# Patient Record
Sex: Female | Born: 1990 | Race: Black or African American | Hispanic: No | Marital: Single | Smoking: Never smoker
Health system: Southern US, Community
[De-identification: ages and names within clinical notes are randomized; demographics above are authoritative.]

## PROBLEM LIST (undated history)

## (undated) DIAGNOSIS — Z3400 Encounter for supervision of normal first pregnancy, unspecified trimester: Secondary | ICD-10-CM

## (undated) DIAGNOSIS — Z3490 Encounter for supervision of normal pregnancy, unspecified, unspecified trimester: Secondary | ICD-10-CM

## (undated) HISTORY — DX: Encounter for supervision of normal first pregnancy, unspecified trimester: Z34.00

---
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
## 2012-12-10 ENCOUNTER — Encounter (HOSPITAL_COMMUNITY): Payer: Self-pay | Admitting: Emergency Medicine

## 2012-12-10 ENCOUNTER — Emergency Department (INDEPENDENT_AMBULATORY_CARE_PROVIDER_SITE_OTHER)
Admission: EM | Admit: 2012-12-10 | Discharge: 2012-12-10 | Disposition: A | Discharge status: Home / Self Care | Payer: No Typology Code available for payment source | Source: Home / Self Care | Attending: Emergency Medicine | Admitting: Emergency Medicine

## 2012-12-10 DIAGNOSIS — M2669 Other specified disorders of temporomandibular joint: Secondary | ICD-10-CM

## 2012-12-10 DIAGNOSIS — F458 Other somatoform disorders: Secondary | ICD-10-CM

## 2012-12-10 DIAGNOSIS — M26629 Arthralgia of temporomandibular joint, unspecified side: Secondary | ICD-10-CM

## 2012-12-10 MED ORDER — CYCLOBENZAPRINE HCL 5 MG PO TABS: 5.0000 mg | ORAL_TABLET | Freq: Three times a day (TID) | ORAL | Status: DC | PRN

## 2012-12-10 MED ORDER — IBUPROFEN 800 MG PO TABS: 800.0000 mg | ORAL_TABLET | Freq: Three times a day (TID) | ORAL | Status: DC

## 2012-12-10 NOTE — ED Provider Notes (Signed)
CSN: [id number]     Arrival date & time 12/10/12  1046 History   First MD Initiated Contact with Patient 12/10/12 1214     Chief Complaint  Patient presents with  . Dental Pain   (Consider location/radiation/quality/duration/timing/severity/associated sxs/prior Treatment) HPI Comments: Pt reports clenching her teeth at night while asleep. Sometimes has to "pop" jaw in the morning to be able to open mouth completely. Woke up 2 mornings ago and was not able to open mouth/pop jaw.    Patient is a 22 y.o. female presenting with tooth pain. The history is provided by the patient.  Dental Pain Toothache location: R jaw. Quality:  Constant and aching Severity:  Moderate Onset quality:  Gradual Duration:  2 days Timing:  Constant Progression:  Unchanged Chronicity:  New Relieved by:  None tried Worsened by:  Nothing tried Ineffective treatments:  None tried Associated symptoms: no fever   Risk factors: periodontal disease     Past Medical History  Diagnosis Date  . Normal pregnancy, first 10/28/2011  . Normal pregnancy 10/29/2011  . SVD (spontaneous vaginal delivery) 10/29/2011   History reviewed. No pertinent past surgical history. Family History  Problem Relation Age of Onset  . Hyperlipidemia Mother   . Hypertension Mother   . Stroke Maternal Grandmother   . Hypertension Maternal Grandmother   . Hyperlipidemia Maternal Grandmother    History  Substance Use Topics  . Smoking status: Never Smoker   . Smokeless tobacco: Not on file  . Alcohol Use: No   OB History   Grav Para Term Preterm Abortions TAB SAB Ect Mult Living   1 1 1       1      Review of Systems  Constitutional: Negative for fever and chills.  HENT: Positive for dental problem.     Allergies  Review of patient's allergies indicates no known allergies.  Home Medications   Current Outpatient Rx  Name  Route  Sig  Dispense  Refill  . cyclobenzaprine (FLEXERIL) 5 MG tablet   Oral   Take 1 tablet (5  mg total) by mouth 3 (three) times daily as needed for muscle spasms.   30 tablet   0   . ibuprofen (ADVIL,MOTRIN) 800 MG tablet   Oral   Take 1 tablet (800 mg total) by mouth 3 (three) times daily.   21 tablet   0    BP 141/84  Pulse 101  Temp(Src) 98 F (36.7 C) (Oral)  Resp 18  SpO2 99% Physical Exam  Constitutional: She appears well-developed and well-nourished. No distress.  HENT:  Mouth/Throat: Dental caries present.  R tmj stiff/tender, unable to open mouth all the way.  Can do ROM with lower jaw, and mouth able to open slightly more after that.     ED Course  Procedures (including critical care time) Labs Review Labs Reviewed - No data to display Imaging Review No results found.  EKG Interpretation    Date/Time:    Ventricular Rate:    PR Interval:    QRS Duration:   QT Interval:    QTC Calculation:   R Axis:     Text Interpretation:              MDM   1. Bruxism   2. TMJ syndrome   pt to purchase mouth guard from drug store and use nightly. Rx ibuprofen 800mg  TID #21 and rx flexeril 5mg  TID prn #30. Pt to f/u with dentist asap. Pt to do mouth stretches  at least BID     Cathlyn Parsons, NP 12/10/12 1248

## 2012-12-10 NOTE — ED Notes (Signed)
C/o lockjaw since saturday States at night while sleeping, patient does clench down on teeth tight Has taken tylenol but no relief.  States she has had tdap in 2012

## 2012-12-10 NOTE — ED Provider Notes (Signed)
Medical screening examination/treatment/procedure(s) were performed by non-physician practitioner and as supervising physician I was immediately available for consultation/collaboration.  Leslee Home, M.D.  Reuben Likes, MD 12/10/12 [phone]

## 2012-12-31 ENCOUNTER — Encounter (HOSPITAL_COMMUNITY): Payer: Self-pay | Admitting: Emergency Medicine

## 2012-12-31 ENCOUNTER — Emergency Department (HOSPITAL_COMMUNITY)
Admission: EM | Admit: 2012-12-31 | Discharge: 2012-12-31 | Disposition: A | Discharge status: Home / Self Care | Payer: No Typology Code available for payment source | Attending: Emergency Medicine | Admitting: Emergency Medicine

## 2012-12-31 DIAGNOSIS — R000 Tachycardia, unspecified: Secondary | ICD-10-CM | POA: Insufficient documentation

## 2012-12-31 DIAGNOSIS — M26609 Unspecified temporomandibular joint disorder, unspecified side: Secondary | ICD-10-CM | POA: Insufficient documentation

## 2012-12-31 DIAGNOSIS — Z791 Long term (current) use of non-steroidal anti-inflammatories (NSAID): Secondary | ICD-10-CM | POA: Insufficient documentation

## 2012-12-31 NOTE — ED Provider Notes (Signed)
Medical screening examination/treatment/procedure(s) were performed by non-physician practitioner and as supervising physician I was immediately available for consultation/collaboration.  EKG Interpretation   None         Mabelle Mungin B. Hawa Henly, MD 12/31/12 1526 

## 2012-12-31 NOTE — ED Notes (Signed)
Pt states "jaw sometimes locks when I sleep.  I went to Urgent Care a couple of weeks ago and they gave me muscle relaxers and Ibuprofen".  Pt states her jaw still feels tight.  Pt is able to move mouth.

## 2012-12-31 NOTE — ED Provider Notes (Signed)
CSN: [id number]     Arrival date & time 12/31/12  1203 History   None   This chart was scribed for Sierra Murray, a non-physician practitioner working with Leonette Most B. Bernette Mayers, MD by Lewanda Rife, ED Scribe. This patient was seen in room TR09C/TR09C and the patient's care was started at 1:44 PM     Chief Complaint  Patient presents with  . Jaw Pain   (Consider location/radiation/quality/duration/timing/severity/associated sxs/prior Treatment) The history is provided by the patient. No language interpreter was used.   HPI Comments: Sierra Murray is a 22 y.o. female who presents to the Emergency Department complaining of occasional moderate right sided jaw pain onset 2 weeks. Reports being evaluated for the same symptoms at urgent care and was prescribed flexeril and 800 mg of ibuprofen with no relief of symptoms. Reports symptoms are exacerbated by eating. Denies associated dental pain, fever, facial swelling, and dysphagia.   Past Medical History  Diagnosis Date  . Normal pregnancy, first 10/28/2011  . Normal pregnancy 10/29/2011  . SVD (spontaneous vaginal delivery) 10/29/2011   History reviewed. No pertinent past surgical history. Family History  Problem Relation Age of Onset  . Hyperlipidemia Mother   . Hypertension Mother   . Stroke Maternal Grandmother   . Hypertension Maternal Grandmother   . Hyperlipidemia Maternal Grandmother    History  Substance Use Topics  . Smoking status: Never Smoker   . Smokeless tobacco: Not on file  . Alcohol Use: No   OB History   Grav Para Term Preterm Abortions TAB SAB Ect Mult Living   1 1 1       1      Review of Systems  Constitutional: Negative for fever.  Psychiatric/Behavioral: Negative for confusion.   A complete 10 system review of systems was obtained and all systems are negative except as noted in the HPI and PMHx.    Allergies  Review of patient's allergies indicates no known allergies.  Home Medications    Current Outpatient Rx  Name  Route  Sig  Dispense  Refill  . cyclobenzaprine (FLEXERIL) 5 MG tablet   Oral   Take 1 tablet (5 mg total) by mouth 3 (three) times daily as needed for muscle spasms.   30 tablet   0   . ibuprofen (ADVIL,MOTRIN) 800 MG tablet   Oral   Take 1 tablet (800 mg total) by mouth 3 (three) times daily.   21 tablet   0    BP 129/82  Pulse 104  Temp(Src) 99 F (37.2 C) (Oral)  Resp 14  Ht 5\' 5"  (1.651 m)  Wt 190 lb 7 oz (86.382 kg)  BMI 31.69 kg/m2  SpO2 100% Physical Exam  Nursing note and vitals reviewed. Constitutional: She is oriented to person, place, and time. She appears well-developed and well-nourished. No distress.  HENT:  Head: Normocephalic and atraumatic.  Mouth/Throat: Uvula is midline, oropharynx is clear and moist and mucous membranes are normal. Mucous membranes are not dry. No trismus in the jaw. Abnormal dentition. No dental caries. No oropharyngeal exudate, posterior oropharyngeal edema or posterior oropharyngeal erythema.  TTP over right TMJ, crepitus with movement on right side.   No facial swelling   Eyes: EOM are normal.  Neck: Neck supple. No tracheal deviation present.  Cardiovascular: Regular rhythm and normal heart sounds.  Tachycardia present.   No murmur heard. Tachy ~ 100.  Pulmonary/Chest: Effort normal and breath sounds normal. No respiratory distress. She has no wheezes. She has no  rales.  Musculoskeletal: Normal range of motion.  Neurological: She is alert and oriented to person, place, and time.  Skin: Skin is warm and dry.  Psychiatric: She has a normal mood and affect. Her behavior is normal.    ED Course  Procedures  COORDINATION OF CARE:  Nursing notes reviewed. Vital signs reviewed. Initial pt interview and examination performed.   1:44 PM-Discussed treatment plan with pt at bedside. Pt agrees with plan.   Treatment plan initiated:Medications - No data to display   Initial diagnostic testing  ordered.    Labs Review Labs Reviewed - No data to display Imaging Review No results found.  EKG Interpretation   None       MDM   1. TMJ (temporomandibular joint syndrome)    Crepitus noted at right TMJ. No dental infection or abscess. No trismus or facial swelling. Well appearing, NAD. Swallows secretions well. NSAIDs, f/u with dentist. Return precautions given. Patient states understanding of treatment care plan and is agreeable.   I personally performed the services described in this documentation, which was scribed in my presence. The recorded information has been reviewed and is accurate.    Trevor Mace, PA-C 12/31/12 1346

## 2013-09-28 IMAGING — US US OB DETAIL+14 WK
1 of 2 series · 12 of 28 positions shown · non-contrast
Comparison: none

[Series 1: us ob detail +14 wk · 76 acquisitions, 12 frames shown]
[im 3/76]
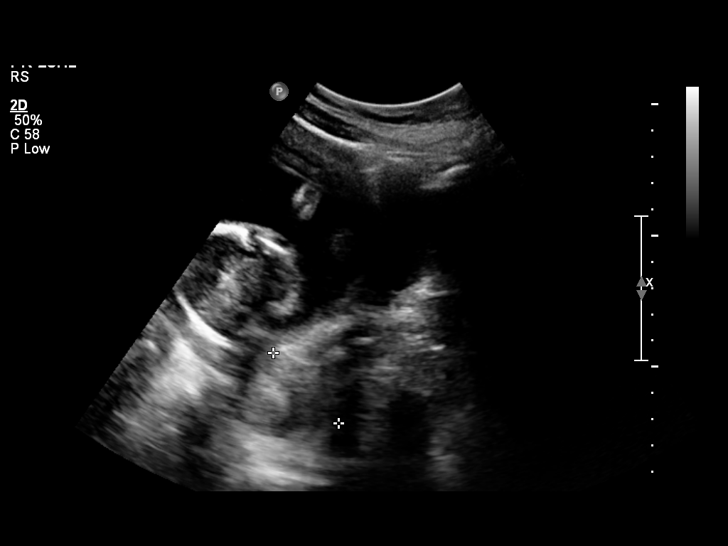
[im 9/76]
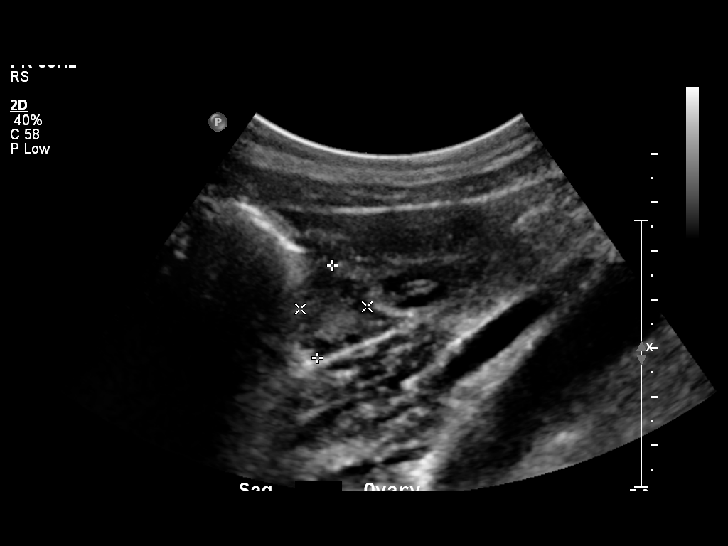
[im 15/76]
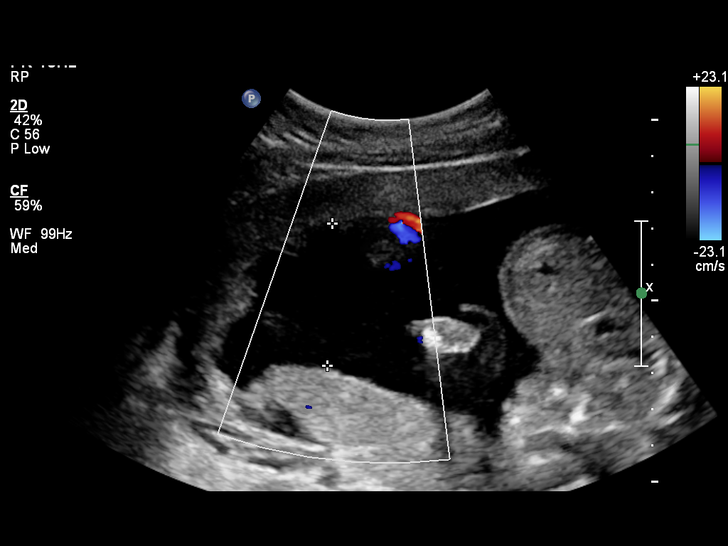
[im 24/76]
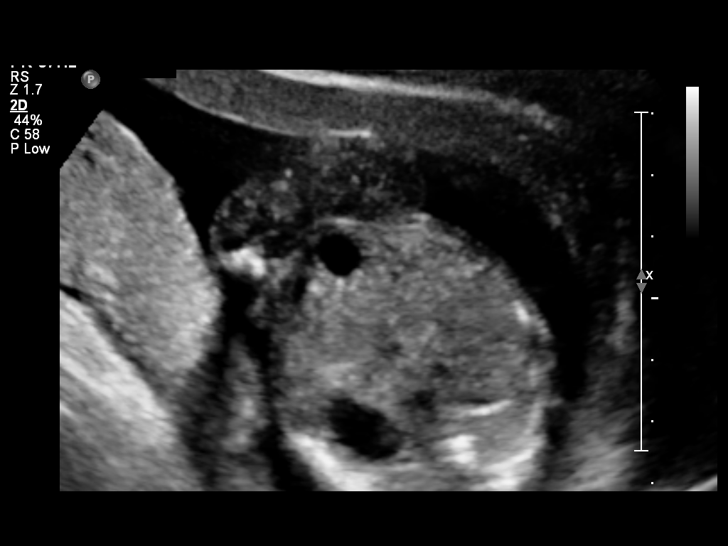
[im 29/76]
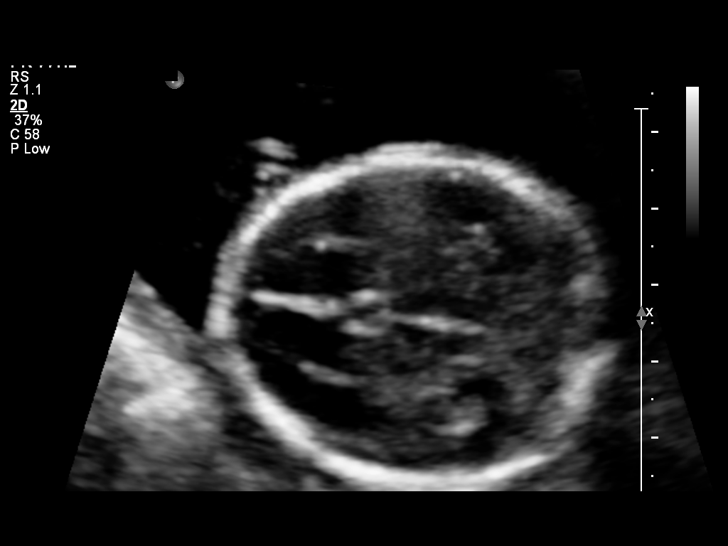
[im 35/76]
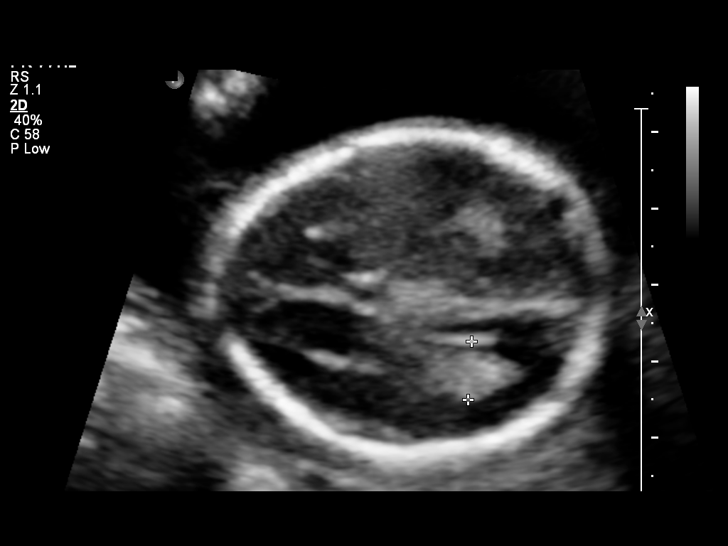
[im 44/76]
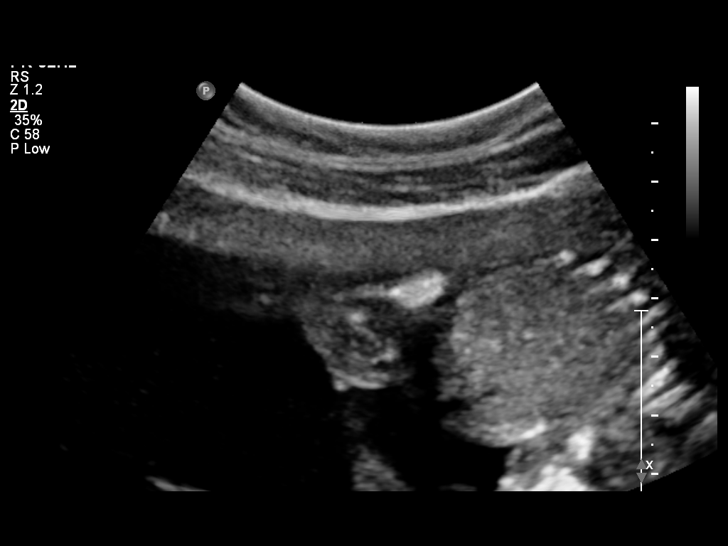
[im 50/76]
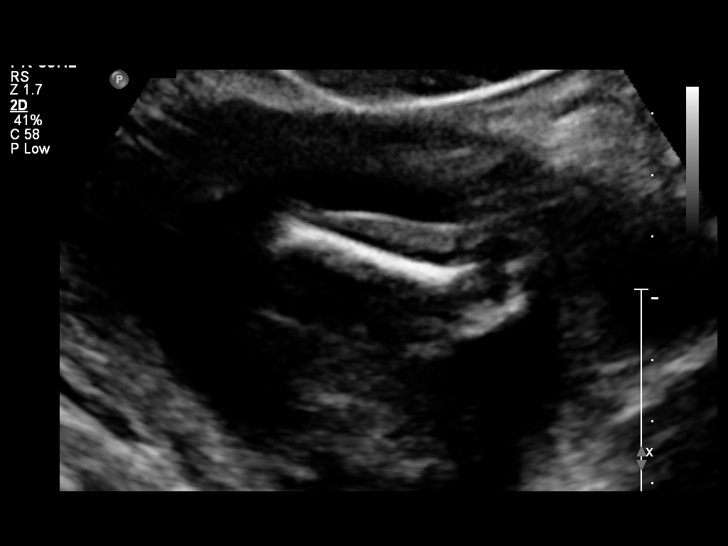
[im 55/76]
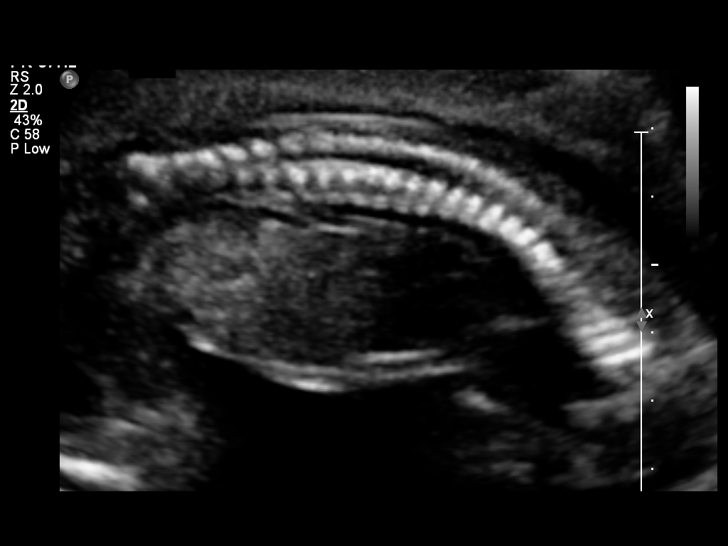
[im 64/76]
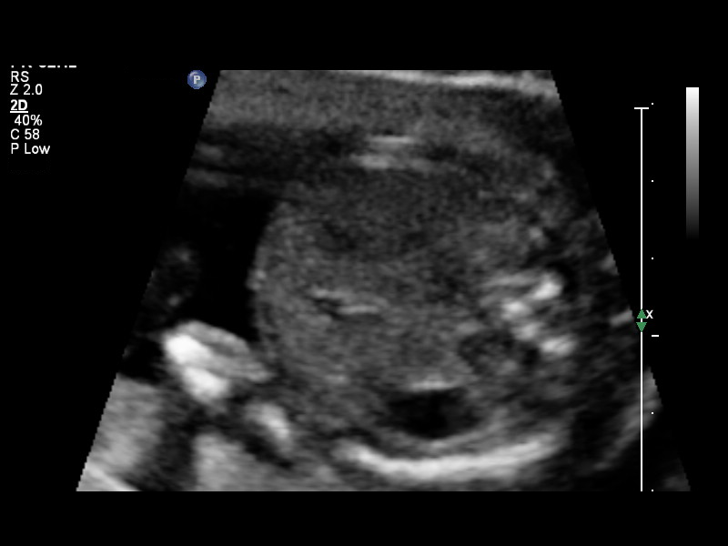
[im 70/76]
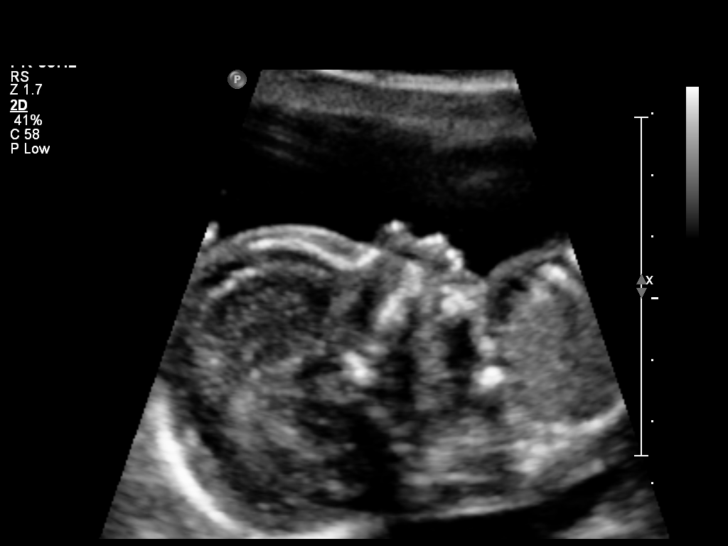
[im 76/76]
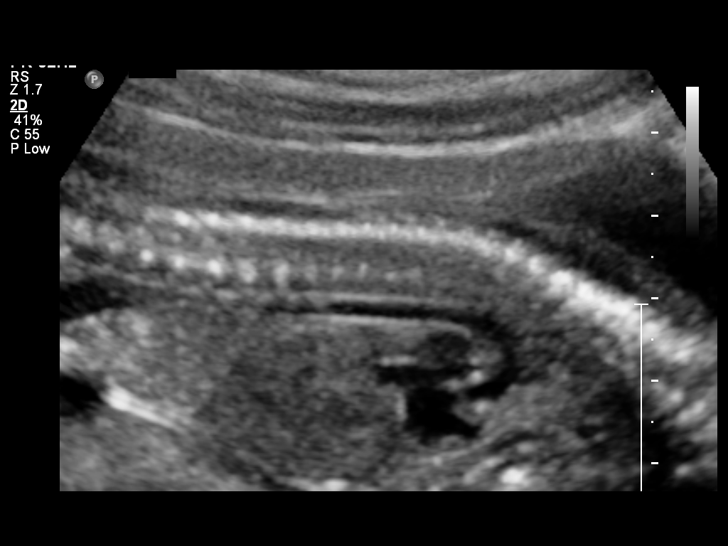

[12 of 28 positions shown; findings below may reference images not displayed]

OBSTETRICS REPORT
                      (Signed Final 05/31/2011 [DATE])

 Order#:         [id number]
Procedures

 US OB DETAIL + 14 WK                                  76811.0
Indications

 Detailed fetal anatomic survey
Fetal Evaluation

 Fetal Heart Rate:  160                         bpm
 Cardiac Activity:  Observed
 Presentation:      Cephalic
 Placenta:          Posterior, above cervical
                    os
 P. Cord            Visualized
 Insertion:

 Amniotic Fluid
 AFI FV:      Subjectively within normal limits
                                             Larg Pckt:     4.0  cm
Biometry

 BPD:     41.6  mm    G. Age:   18w 4d                CI:        74.14   70 - 86
                                                      FL/HC:      18.1   16.1 -

 HC:     153.4  mm    G. Age:   18w 2d       24  %    HC/AC:      1.14   1.09 -

 AC:     135.1  mm    G. Age:   19w 0d       55  %    FL/BPD:
 FL:      27.7  mm    G. Age:   18w 3d       36  %    FL/AC:      20.5   20 - 24
 HUM:     27.6  mm    G. Age:   18w 6d       55  %
 NFT:     3.83  mm

 Est. FW:     253  gm      0 lb 9 oz     45  %
Gestational Age

 LMP:           18w 5d       Date:   01/20/11                 EDD:   10/27/11
 U/S Today:     18w 4d                                        EDD:   10/28/11
 Best:          18w 5d    Det. By:   LMP  (01/20/11)          EDD:   10/27/11
Genetic Sonogram - Trisomy 21 Screening

 Age:                                             21          Risk=1:   885
 Echogenic bowel:                                 No          LR :
 Hypoplastic/absent Nasal bone:                   No
 Choroid plexus cysts:                            No
 Structural anomalies (inc. cardiac):             No          LR :
 Hypoplastic / absent midphalanx 5th Digit:       No
 Short femur:                                     No          LR :
 Wide space 5st-9nd toes:                         No
 Short humerus:                                   No          LR :
 2-vessel umbilical cord:                         No
 Pyelectasis:                                     No          LR :
 Echogenic cardiac foci:                          No          LR :
 Nuchal fold thickening >= 6 mm:                  No          LR :

 12 Of 12 Criteria Were Visualized and 0 Abnormal(s) Were Seen.
 Ultrasound Modified Risk for Fetal Down Syndrome = [DATE]
Anatomy

 Cranium:           Appears normal      Aortic Arch:       Appears normal
 Fetal Cavum:       Appears normal      Ductal Arch:       Appears normal
 Ventricles:        Appears normal      Diaphragm:         Appears normal
 Choroid Plexus:    Appears normal      Stomach:           Appears
                                                           normal, left
                                                           sided
 Cerebellum:        Appears normal      Abdomen:           Appears normal
 Posterior Fossa:   Appears normal      Abdominal Wall:    Appears nml
                                                           (cord insert,
                                                           abd wall)
 Nuchal Fold:       Appears normal      Cord Vessels:      Appears normal
                    (neck, nuchal                          (3 vessel cord)
                    fold)
 Face:              Lips and orbits     Kidneys:           Appear normal
                    appear normal
 Heart:             Appears normal      Bladder:           Appears normal
                    (4 chamber &
                    axis)
 RVOT:              Appears normal      Spine:             Appears normal
 LVOT:              Appears normal      Limbs:             Appears normal
                                                           (hands, ankles,
                                                           feet)

 Other:     Fetus appears to be a female. Heels and left 5th digit
            visualized. Technically difficult due to fetal position.
Cervix Uterus Adnexa

 Cervical Length:   3.6       cm

 Cervix:       Normal appearance by transabdominal scan.
 Uterus:       No abnormality visualized.
 Left Ovary:   Within normal limits.
 Right Ovary:  Within normal limits.
 Adnexa:     No abnormality visualized.
Impression

  Single living intrauterine gestation with concordant
 gestational age and normal visualized anatomy. No
 sonographic markers for aneuploidy visualized;  see above
 for US modified Trisomy 21 risk calculation.
 questions or concerns.

## 2013-10-04 ENCOUNTER — Emergency Department (HOSPITAL_COMMUNITY): Payer: BC Managed Care – PPO

## 2013-10-04 ENCOUNTER — Emergency Department (HOSPITAL_COMMUNITY)
Admission: EM | Admit: 2013-10-04 | Discharge: 2013-10-04 | Disposition: A | Discharge status: Home / Self Care | Payer: BC Managed Care – PPO | Attending: Emergency Medicine | Admitting: Emergency Medicine

## 2013-10-04 ENCOUNTER — Encounter (HOSPITAL_COMMUNITY): Payer: Self-pay | Admitting: Emergency Medicine

## 2013-10-04 DIAGNOSIS — M533 Sacrococcygeal disorders, not elsewhere classified: Secondary | ICD-10-CM | POA: Diagnosis present

## 2013-10-04 DIAGNOSIS — R000 Tachycardia, unspecified: Secondary | ICD-10-CM | POA: Insufficient documentation

## 2013-10-04 DIAGNOSIS — M25559 Pain in unspecified hip: Secondary | ICD-10-CM

## 2013-10-04 DIAGNOSIS — Z79899 Other long term (current) drug therapy: Secondary | ICD-10-CM | POA: Insufficient documentation

## 2013-10-04 NOTE — ED Provider Notes (Signed)
CSN: [id number]     Arrival date & time 10/04/13  1432 History  This chart was scribed for non-physician practitioner, Johnnette Gourd, PA-C, working with Hurman Horn, MD by Charline Bills, ED Scribe. This patient was seen in room TR08C/TR08C and the patient's care was started at 3:48 PM.   Chief Complaint  Patient presents with  . Tailbone Pain   The history is provided by the patient. No language interpreter was used.   HPI Comments: Sierra Murray is a 23 y.o. female who presents to the Emergency Department complaining of intermittent, gradually worsening coccyx pain onset a few months ago, worsened since yesterday. Pt reports falling backwards a few months ago and landing on her coccyx. She noted a knot to her coccyx yesterday. She currently rates her pain 9/10. Pt recently returned to work 3 weeks ago which she suspects exacerbated her pain. Pain does not radiate. She denies back pain and any other symptoms at this time. Pt took Tylenol yesterday for pain with minimal relief.  Past Medical History  Diagnosis Date  . Normal pregnancy, first 10/28/2011  . Normal pregnancy 10/29/2011  . SVD (spontaneous vaginal delivery) 10/29/2011   History reviewed. No pertinent past surgical history. Family History  Problem Relation Age of Onset  . Hyperlipidemia Mother   . Hypertension Mother   . Stroke Maternal Grandmother   . Hypertension Maternal Grandmother   . Hyperlipidemia Maternal Grandmother    History  Substance Use Topics  . Smoking status: Never Smoker   . Smokeless tobacco: Not on file  . Alcohol Use: No   OB History   Grav Para Term Preterm Abortions TAB SAB Ect Mult Living   1 1 1       1      Review of Systems  Musculoskeletal: Negative for back pain.       + Coccyx pain  All other systems reviewed and are negative.  Allergies  Review of patient's allergies indicates no known allergies.  Home Medications   Prior to Admission medications   Medication Sig Start Date  End Date Taking? Authorizing Provider  acetaminophen (TYLENOL) 500 MG tablet Take 500 mg by mouth every 6 (six) hours as needed for mild pain.    Historical Provider, MD  cyclobenzaprine (FLEXERIL) 5 MG tablet Take 1 tablet (5 mg total) by mouth 3 (three) times daily as needed for muscle spasms. 12/10/12   Cathlyn Parsons, NP   Triage Vitals: BP 150/81  Pulse 110  Temp(Src) 98.1 F (36.7 C) (Oral)  Resp 18  Ht 5' 5.5" (1.664 m)  Wt 185 lb (83.915 kg)  BMI 30.31 kg/m2  SpO2 100%  LMP 09/27/2013 Physical Exam  Nursing note and vitals reviewed. Constitutional: She is oriented to person, place, and time. She appears well-developed and well-nourished. No distress.  HENT:  Head: Normocephalic and atraumatic.  Mouth/Throat: Oropharynx is clear and moist.  Eyes: Conjunctivae are normal.  Neck: Normal range of motion. Neck supple. No spinous process tenderness and no muscular tenderness present.  Cardiovascular: Regular rhythm and normal heart sounds.   Tachycardic.  Pulmonary/Chest: Effort normal and breath sounds normal. No respiratory distress.  Musculoskeletal: She exhibits no edema.       Back:  TTP over tip of coccyx. No overlying erythema, edema, abscess, pilonidal cyst.  Neurological: She is alert and oriented to person, place, and time. She has normal strength.  Strength lower extremities 5/5 and equal bilateral. Sensation intact. Normal gait.  Skin: Skin is warm  and dry. No rash noted. She is not diaphoretic.  Psychiatric: She has a normal mood and affect. Her behavior is normal.    ED Course  Procedures (including critical care time) DIAGNOSTIC STUDIES: Oxygen Saturation is 100% on RA, normal by my interpretation.    COORDINATION OF CARE:  3:52 PM-Discussed treatment plan which includes XR with pt at bedside and pt agreed to plan.   Labs Review Labs Reviewed - No data to display  Imaging Review Dg Sacrum/coccyx  10/04/2013   CLINICAL DATA:  Pain from fall over 1  year ago.  No recent injury.  EXAM: SACRUM AND COCCYX - 2+ VIEW  COMPARISON:  None.  FINDINGS: Soft structures are unremarkable. No focal bony abnormality identified. SI joints and both hips are unremarkable. Pre sacral soft tissues are normal. Pelvic calcification consistent with phlebolith.  IMPRESSION: No acute abnormality.   Electronically Signed   By: Maisie Fushomas  Register   On: 10/04/2013 15:56     EKG Interpretation None      MDM   Final diagnoses:  Coxalgia, unspecified laterality   Pt presenting with coccyx pain after injury a few months ago, exacerbated recently. She is non-toxic appearing and in NAD. Afebrile. Tachycardic, vitals otherwise stable. TTP over coccyx. No overlying abnormalities as stated above. Xray without any acute findings. Advised ice alternated with heat, NSAIDs. Stable for d/c. Return precautions given. Patient states understanding of treatment care plan and is agreeable.  I personally performed the services described in this documentation, which was scribed in my presence. The recorded information has been reviewed and is accurate.     Trevor MaceRobyn M Albert, PA-C 10/04/13 1827

## 2013-10-04 NOTE — Discharge Instructions (Signed)
You may take ibuprofen every 6-8 hours as needed for pain. Apply warm compresses alternated with ice. Tailbone Injury The tailbone (coccyx) is the small bone at the lower end of the spine. A tailbone injury may involve stretched ligaments, bruising, or a broken bone (fracture). Women are more vulnerable to this injury due to having a wider pelvis. CAUSES  This type of injury typically occurs from falling and landing on the tailbone. Repeated strain or friction from actions such as rowing and bicycling may also injure the area. The tailbone can be injured during childbirth. Infections or tumors may also press on the tailbone and cause pain. Sometimes, the cause of injury is unknown. SYMPTOMS   Bruising.  Pain when sitting.  Painful bowel movements.  In women, pain during intercourse. DIAGNOSIS  Your caregiver can diagnose a tailbone injury based on your symptoms and a physical exam. X-rays may be taken if a fracture is suspected. Your caregiver may also use an MRI scan imaging test to evaluate your symptoms. TREATMENT  Your caregiver may prescribe medicines to help relieve your pain. Most tailbone injuries heal on their own in 4 to 6 weeks. However, if the injury is caused by an infection or tumor, the recovery period may vary. PREVENTION  Wear appropriate padding and sports gear when bicycling and rowing. This can help prevent an injury from repeated strain or friction. HOME CARE INSTRUCTIONS   Put ice on the injured area.  Put ice in a plastic bag.  Place a towel between your skin and the bag.  Leave the ice on for 15-20 minutes, every hour while awake for the first 1 to 2 days.  Sit on a large, rubber or inflated ring or cushion to ease your pain. Lean forward when sitting to help decrease discomfort.  Avoid sitting for long periods of time.  Increase your activity as the pain allows.  Only take over-the-counter or prescription medicines for pain, discomfort, or fever as  directed by your caregiver.  You may use stool softeners if it is painful to have a bowel movement, or as directed by your caregiver.  Eat a diet with plenty of fiber to help prevent constipation.  Keep all follow-up appointments as directed by your caregiver. SEEK MEDICAL CARE IF:   Your pain becomes worse.  Your bowel movements cause a great deal of discomfort.  You are unable to have a bowel movement.  You have a fever. MAKE SURE YOU:  Understand these instructions.  Will watch your condition.  Will get help right away if you are not doing well or get worse. Document Released: 12/18/1999 Document Revised: 03/14/2011 Document Reviewed: 07/15/2010 [facility] Patient Information 2015 [employer], [place]. This information is not intended to replace advice given to you by your health care provider. Make sure you discuss any questions you have with your health care provider.

## 2013-10-04 NOTE — ED Notes (Signed)
Patient transported to X-ray 

## 2013-10-04 NOTE — ED Notes (Signed)
Pt reports falling backwards several months ago, landing on her tailbone. Reports pain has been progressively worsening x 3 weeks. Worse with sitting. Denies taking anything for pain. NAD.

## 2013-10-05 NOTE — ED Provider Notes (Signed)
Medical screening examination/treatment/procedure(s) were performed by non-physician practitioner and as supervising physician I was immediately available for consultation/collaboration.   EKG Interpretation None       Hurman HornJohn M Cicily Bonano, MD 10/05/13 [id number]

## 2013-10-07 ENCOUNTER — Emergency Department (INDEPENDENT_AMBULATORY_CARE_PROVIDER_SITE_OTHER)
Admission: EM | Admit: 2013-10-07 | Discharge: 2013-10-07 | Disposition: A | Discharge status: Home / Self Care | Payer: BC Managed Care – PPO | Source: Home / Self Care | Attending: Family Medicine | Admitting: Family Medicine

## 2013-10-07 ENCOUNTER — Encounter (HOSPITAL_COMMUNITY): Payer: Self-pay | Admitting: Emergency Medicine

## 2013-10-07 DIAGNOSIS — L0501 Pilonidal cyst with abscess: Secondary | ICD-10-CM

## 2013-10-07 MED ORDER — CEPHALEXIN 500 MG PO CAPS: 500.0000 mg | ORAL_CAPSULE | Freq: Four times a day (QID) | ORAL | Status: DC

## 2013-10-07 MED ORDER — LIDOCAINE-EPINEPHRINE (PF) 2 %-1:200000 IJ SOLN
INTRAMUSCULAR | Status: AC
  Filled 2013-10-07: qty 20

## 2013-10-07 NOTE — ED Provider Notes (Signed)
Sierra RoundWhitney C Murray is a 23 y.o. female who presents to Urgent Care today for pilonidal cyst. Patient has had pain at the coccyx area for several days now. It began to drain today. She notes foul odor associated with a mild fever and tachycardia. No nodules vomiting or diarrhea. No chest pain palpitations or shortness of breath. No history of pilonidal cyst.   Past Medical History  Diagnosis Date  . Normal pregnancy, first 10/28/2011  . Normal pregnancy 10/29/2011  . SVD (spontaneous vaginal delivery) 10/29/2011   History  Substance Use Topics  . Smoking status: Never Smoker   . Smokeless tobacco: Not on file  . Alcohol Use: No   ROS as above Medications: No current facility-administered medications for this encounter.   Current Outpatient Prescriptions  Medication Sig Dispense Refill  . acetaminophen (TYLENOL) 500 MG tablet Take 500 mg by mouth every 6 (six) hours as needed for mild pain.      . cephALEXin (KEFLEX) 500 MG capsule Take 1 capsule (500 mg total) by mouth 4 (four) times daily.  28 capsule  0  . cyclobenzaprine (FLEXERIL) 5 MG tablet Take 1 tablet (5 mg total) by mouth 3 (three) times daily as needed for muscle spasms.  30 tablet  0    Exam:  LMP 09/27/2013 Gen: Well NAD nontoxic appearing Skin: Pilonidal area with erythema induration and tenderness with watery pus discharging from a small opening at the pilonidal area.   Pilonidal cyst abscess incision and drainage:  Consent obtained and timeout performed.  Skin cleansed alcohol 5 mL of lidocaine with epinephrine injected achieving good anesthesia.  Skin clear with Betadine. A decision was made at the opening down to the area of fluctuance. Watery pus was discharged. Pus cultured. The initial incision was widened. Blunt dissection was used to break up any loculations.  5 inches of 1/2 inch packing material was inserted into the abscess. Patient tolerated the procedure well.    No results found for this or any previous  visit (from the past 24 hour(s)). No results found.  Assessment and Plan: [age] y.o. female with pilonidal cyst abscess. Status post incision and drainage. Culture pending. Treatment with Keflex. Followup with PCP. Return in 3 days for packing removal.  Discussed warning signs or symptoms. Please see discharge instructions. Patient expresses understanding.     Rodolph BongEvan S Brynley Cuddeback, MD 10/07/13 1336

## 2013-10-07 NOTE — Discharge Instructions (Signed)
Thank you for coming in today. Take Keflex 4 times daily for one week for infection Take ibuprofen or Aleve for pain.  Return in 3 days for packing removal or removed the packing material your self.  Followup with your primary care provider.    Pilonidal Cyst, Care After A pilonidal cyst occurs when hairs get trapped (ingrown) beneath the skin in the crease between the buttocks over your sacrum (the bone under that crease). Pilonidal cysts are most common in young men with a lot of body hair. When the cyst breaks(ruptured) or leaks, fluid from the cyst may cause burning and itching. If the cyst becomes infected, it causes a painful swelling filled with pus (abscess). The pus and trapped hairs need to be removed (often by lancing) so that the infection can heal. The word pilonidal means hair nest. HOME CARE INSTRUCTIONS If the pilonidal sinus was NOT DRAINING OR LANCED:  Keep the area clean and dry. Bathe or shower daily. Wash the area well with a germ-killing soap. Hot tub baths may help prevent infection. Dry the area well with a towel.  Avoid tight clothing in order to keep area as moisture-free as possible.  Keep area between buttocks as free from hair as possible. A depilatory may be used.  Take antibiotics as directed.  Only take over-the-counter or prescription medicines for pain, discomfort, or fever as directed by your caregiver. If the cyst WAS INFECTED AND NEEDED TO BE DRAINED:  Your caregiver may have packed the wound with gauze to keep the wound open. This allows the wound to heal from the inside outward and continue to drain.  Return as directed for a wound check.  If you take tub baths or showers, repack the wound with gauze as directed following. Sponge baths are a good alternative. Sitz baths may be used three to four times a day or as directed.  If an antibiotic was ordered to fight the infection, take as directed.  Only take over-the-counter or prescription medicines  for pain, discomfort, or fever as directed by your caregiver.  If a drain was in place and removed, use sitz baths for 20 minutes 4 times per day. Clean the wound gently with mild unscented soap, pat dry, and then apply a dry dressing as directed. If you had surgery and IT WAS MARSUPIALIZED (LEFT OPEN):  Your wound was packed with gauze to keep the wound open. This allows the wound to heal from the inside outwards and continue draining. The changing of the dressing regularly also helps keep the wound clean.  Return as directed for a wound check.  If you take tub baths or showers, repack the wound with gauze as directed following. Sponge baths are a good alternative. Sitz baths can also be used. This may be done three to four times a day or as directed.  If an antibiotic was ordered to fight the infection, take as directed.  Only take over-the-counter or prescription medicines for pain, discomfort, or fever as directed by your caregiver.  If you had surgery and the wound was closed you may care for it as directed. This generally includes keeping it dry and clean and dressing it as directed. SEEK MEDICAL CARE IF:   You have increased pain, swelling, redness, drainage, or bleeding from the area.  You have a fever.  You have muscles aches, dizziness, or a general ill feeling. Document Released: 01/20/2006 Document Revised: 08/22/2012 Document Reviewed: 04/06/2006 [facility] Barry RoadExitCare Patient Information 2015 [employer], [place]LL[place]. This information is not  intended to replace advice given to you by your health care provider. Make sure you discuss any questions you have with your health care provider. ° °

## 2013-10-07 NOTE — ED Notes (Signed)
Pt in today because of a draining abscess in her rectal area, pt states that the area has been very painful and started to drain today, pt says that the drainage has a fowl odor

## 2013-10-11 LAB — CULTURE, ROUTINE-ABSCESS

## 2013-11-04 ENCOUNTER — Encounter (HOSPITAL_COMMUNITY): Payer: Self-pay | Admitting: Emergency Medicine

## 2013-12-13 ENCOUNTER — Emergency Department (HOSPITAL_COMMUNITY)
Admission: EM | Admit: 2013-12-13 | Discharge: 2013-12-13 | Disposition: A | Discharge status: Home / Self Care | Payer: BC Managed Care – PPO | Attending: Emergency Medicine | Admitting: Emergency Medicine

## 2013-12-13 ENCOUNTER — Emergency Department (HOSPITAL_COMMUNITY): Payer: BC Managed Care – PPO

## 2013-12-13 ENCOUNTER — Encounter (HOSPITAL_COMMUNITY): Payer: Self-pay | Admitting: Emergency Medicine

## 2013-12-13 DIAGNOSIS — Y9389 Activity, other specified: Secondary | ICD-10-CM | POA: Insufficient documentation

## 2013-12-13 DIAGNOSIS — S8001XA Contusion of right knee, initial encounter: Secondary | ICD-10-CM | POA: Insufficient documentation

## 2013-12-13 DIAGNOSIS — Y998 Other external cause status: Secondary | ICD-10-CM | POA: Diagnosis not present

## 2013-12-13 DIAGNOSIS — S4992XA Unspecified injury of left shoulder and upper arm, initial encounter: Secondary | ICD-10-CM | POA: Insufficient documentation

## 2013-12-13 DIAGNOSIS — S8002XA Contusion of left knee, initial encounter: Secondary | ICD-10-CM

## 2013-12-13 DIAGNOSIS — Y92410 Unspecified street and highway as the place of occurrence of the external cause: Secondary | ICD-10-CM | POA: Insufficient documentation

## 2013-12-13 DIAGNOSIS — M25512 Pain in left shoulder: Secondary | ICD-10-CM

## 2013-12-13 DIAGNOSIS — Z79899 Other long term (current) drug therapy: Secondary | ICD-10-CM | POA: Diagnosis not present

## 2013-12-13 DIAGNOSIS — S8992XA Unspecified injury of left lower leg, initial encounter: Secondary | ICD-10-CM | POA: Diagnosis present

## 2013-12-13 MED ORDER — CYCLOBENZAPRINE HCL 5 MG PO TABS: 5.0000 mg | ORAL_TABLET | Freq: Three times a day (TID) | ORAL | Status: DC | PRN

## 2013-12-13 MED ORDER — HYDROCODONE-ACETAMINOPHEN 5-325 MG PO TABS: 1.0000 | ORAL_TABLET | Freq: Four times a day (QID) | ORAL | Status: DC | PRN

## 2013-12-13 MED ORDER — CYCLOBENZAPRINE HCL 10 MG PO TABS
5.0000 mg | ORAL_TABLET | Freq: Once | ORAL | Status: DC
  Filled 2013-12-13: qty 1

## 2013-12-13 MED ORDER — HYDROCODONE-ACETAMINOPHEN 5-325 MG PO TABS
1.0000 | ORAL_TABLET | Freq: Once | ORAL | Status: DC
  Filled 2013-12-13: qty 1

## 2013-12-13 MED ORDER — IBUPROFEN 600 MG PO TABS: 600.0000 mg | ORAL_TABLET | Freq: Four times a day (QID) | ORAL | Status: DC | PRN

## 2013-12-13 MED ORDER — KETOROLAC TROMETHAMINE 30 MG/ML IJ SOLN
30.0000 mg | Freq: Once | INTRAMUSCULAR | Status: AC
  Administered 2013-12-13: 30 mg via INTRAMUSCULAR
  Filled 2013-12-13: qty 1

## 2013-12-13 NOTE — ED Notes (Signed)
Pt verbalizes understanding of d/c instructions and denies any further needs at this time. 
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
## 2013-12-13 NOTE — Discharge Instructions (Signed)
Your xray is normal

## 2016-02-23 ENCOUNTER — Ambulatory Visit (INDEPENDENT_AMBULATORY_CARE_PROVIDER_SITE_OTHER): Payer: Medicaid Other | Admitting: Family Medicine

## 2016-02-23 ENCOUNTER — Encounter: Payer: Self-pay | Admitting: Family Medicine

## 2016-02-23 ENCOUNTER — Other Ambulatory Visit (HOSPITAL_COMMUNITY)
Admission: RE | Admit: 2016-02-23 | Discharge: 2016-02-23 | Disposition: A | Discharge status: Home / Self Care | Payer: Medicaid Other | Source: Ambulatory Visit | Attending: Family Medicine | Admitting: Family Medicine

## 2016-02-23 VITALS — BP 110/64 | HR 110 | Temp 98.5°F | Wt 198.0 lb

## 2016-02-23 DIAGNOSIS — L83 Acanthosis nigricans: Secondary | ICD-10-CM | POA: Diagnosis not present

## 2016-02-23 DIAGNOSIS — Z113 Encounter for screening for infections with a predominantly sexual mode of transmission: Secondary | ICD-10-CM | POA: Diagnosis present

## 2016-02-23 DIAGNOSIS — N898 Other specified noninflammatory disorders of vagina: Secondary | ICD-10-CM | POA: Insufficient documentation

## 2016-02-23 DIAGNOSIS — Z3009 Encounter for other general counseling and advice on contraception: Secondary | ICD-10-CM

## 2016-02-23 DIAGNOSIS — Z124 Encounter for screening for malignant neoplasm of cervix: Secondary | ICD-10-CM | POA: Diagnosis not present

## 2016-02-23 DIAGNOSIS — Z01419 Encounter for gynecological examination (general) (routine) without abnormal findings: Secondary | ICD-10-CM | POA: Diagnosis present

## 2016-02-23 LAB — BASIC METABOLIC PANEL WITH GFR
BUN: 6 mg/dL — ABNORMAL LOW (ref 7–25)
CHLORIDE: 104 mmol/L (ref 98–110)
CO2: 28 mmol/L (ref 20–31)
Calcium: 9.7 mg/dL (ref 8.6–10.2)
Creat: 0.75 mg/dL (ref 0.50–1.10)
GFR, Est African American: >89 mL/min (ref >=60)
GFR, Est Non African American: >89 mL/min (ref >=60)
Glucose, Bld: 97 mg/dL (ref 65–99)
Potassium: 4.3 mmol/L (ref 3.5–5.3)
SODIUM: 140 mmol/L (ref 135–146)

## 2016-02-23 LAB — POCT WET PREP (WET MOUNT)
Clue Cells Wet Prep Whiff POC: NEGATIVE
Trichomonas Wet Prep HPF POC: ABSENT

## 2016-02-23 LAB — POCT GLYCOSYLATED HEMOGLOBIN (HGB A1C): Hemoglobin A1C: 5.7

## 2016-02-23 NOTE — Patient Instructions (Addendum)
It was a pleasure to see you today! Thank you for choosing [facility] for your primary care. Sierra Murray was seen for new patient visit. See you back soon for the Mirena placement. I will call you if lab results are abnormal. Take 400mg  Ibuprofen before you come in for your Mirena appointment.   Best,  Dr. Chanetta Marshallimberlake

## 2016-02-23 NOTE — Assessment & Plan Note (Signed)
Vaginal odor x >1 month. Has been using soap in the vagina. Partner with recent suspicious behavior per the patient, will do GC test as well as wet prep.

## 2016-02-23 NOTE — Assessment & Plan Note (Signed)
Extensive discussion of all types of birth control, including that LARCs are most effective. Patient did not like the Nexplanon. Had it inserted after her daughter's birth and felt that she had both postpartum mood changes and hormonal mood swings. Also had near continuous spotting. Is sexually active, currently using condoms. Patient would like Mirena, appointment scheduled for next week.

## 2016-02-23 NOTE — Progress Notes (Signed)
   CC: new patient  HPI No significant medical history. [place] also with [place]. Sister (20) with T1DM and HTN.  Daughter who is 4 - induced for post dates, epidural, NSVD. No GDM or GHTN.  Had nexplanon, didn't do well with bleeding.  No surgical history.  Vaginal odor x >1 week, has been using soap on the outside, hx of BV in remote past.   CC, SH/smoking status, and VS noted  Objective: BP 110/64   Pulse (!) 110   Temp 98.5 F (36.9 C) (Oral)   Wt 198 lb (89.8 kg)   LMP 02/08/2016   SpO2 98%   BMI 32.45 kg/m  Gen: NAD, alert, cooperative, and pleasant overweight female.  HEENT: NCAT, EOMI, PERRL CV: RRR, no murmur Resp: CTAB, no wheezes, non-labored Abd: SNTND, BS present, no guarding or organomegaly Ext: No edema, warm Neuro: Alert and oriented, Speech clear, No gross deficits  Assessment and plan:  Birth control counseling Extensive discussion of all types of birth control, including that LARCs are most effective. Patient did not like the Nexplanon. Had it inserted after her daughter's birth and felt that she had both postpartum mood changes and hormonal mood swings. Also had near continuous spotting. Is sexually active, currently using condoms. Patient would like Mirena, appointment scheduled for next week.   Vaginal discharge Vaginal odor x >1 month. Has been using soap in the vagina. Partner with recent suspicious behavior per the patient, will do GC test as well as wet prep.   Acanthosis nigricans Patient has noticed this for some time, has tried to scrub it off. She says someone checked her for DM in the past and she did not have it. No hx of BMP or HgbA1c in our system, will order both today. Will consider ordering lipid panel if HgbA1c is concerning. Recommended diet and exercise. Sister with T1DM.   Orders Placed This Encounter  Procedures  . BASIC METABOLIC PANEL WITH GFR  . POCT Wet Prep [employer]([place]et [place])  . POCT glycosylated  hemoglobin (Hb A1C)    Loni MuseKate Timberlake, MD, PGY1 02/23/2016 2:19 PM

## 2016-02-23 NOTE — Assessment & Plan Note (Addendum)
Patient has noticed this for some time, has tried to scrub it off. She says someone checked her for DM in the past and she did not have it. No hx of BMP or HgbA1c in our system, will order both today. Will consider ordering lipid panel if HgbA1c is concerning. Recommended diet and exercise. Sister with T1DM.

## 2016-02-24 ENCOUNTER — Encounter: Payer: Self-pay | Admitting: Family Medicine

## 2016-02-24 ENCOUNTER — Telehealth: Payer: Self-pay | Admitting: Family Medicine

## 2016-02-24 LAB — CYTOLOGY - PAP
Chlamydia: NEGATIVE
DIAGNOSIS: NEGATIVE
Neisseria Gonorrhea: NEGATIVE

## 2016-02-24 NOTE — Telephone Encounter (Signed)
Pt would like someone to call her to discuss lab results. ep

## 2016-02-24 NOTE — Telephone Encounter (Signed)
Just made a lab letter that everything was normal, but I am calling her now. Cancel that lab letter please.

## 2016-02-29 ENCOUNTER — Ambulatory Visit (INDEPENDENT_AMBULATORY_CARE_PROVIDER_SITE_OTHER): Payer: Medicaid Other | Admitting: Family Medicine

## 2016-02-29 ENCOUNTER — Encounter: Payer: Self-pay | Admitting: Family Medicine

## 2016-02-29 VITALS — BP 112/64 | HR 83 | Temp 98.3°F | Ht 66.0 in | Wt 198.0 lb

## 2016-02-29 DIAGNOSIS — Z30430 Encounter for insertion of intrauterine contraceptive device: Secondary | ICD-10-CM | POA: Diagnosis present

## 2016-02-29 LAB — POCT URINE PREGNANCY: Preg Test, Ur: NEGATIVE

## 2016-02-29 MED ORDER — PROBIOTIC PO CAPS: 1.0000 | ORAL_CAPSULE | Freq: Every day | ORAL | 3 refills | Status: DC

## 2016-02-29 MED ORDER — LEVONORGESTREL 20 MCG/24HR IU IUD
INTRAUTERINE_SYSTEM | Freq: Once | INTRAUTERINE | Status: AC
  Administered 2016-02-29: 1 via INTRAUTERINE

## 2016-02-29 NOTE — Patient Instructions (Signed)
It was a pleasure to see you today! Thank you for choosing [facility] for your primary care. Sierra Murray was seen for mirena insertion. Come back to the clinic if you are concerned about your spotting or have a fever and pain, which you should not have. Make an appointment to see me back in a month or so to check the strings after you have had a period so that we know it's in the right place.   Best,  Dr. Chanetta Marshallimberlake

## 2016-02-29 NOTE — Progress Notes (Signed)
IUD Insertion Procedure Note  Pre-operative Diagnosis: need for birth control  Post-operative Diagnosis: same  Indications: contraception  Procedure Details  Urine pregnancy test was done today and result was negative.  The risks (including infection, bleeding, pain, and uterine perforation) and benefits of the procedure were explained to the patient and Written informed consent was obtained.    Cervix cleansed with Betadine. Uterus sounded to 9 cm. IUD inserted without difficulty. String visible and trimmed. Patient tolerated procedure well.  IUD Information: Mirena.  Condition: Stable  Complications: None  Plan:  The patient was advised to call for any fever or for prolonged or severe pain or bleeding. She was advised to use NSAID as needed for mild to moderate pain.    Additionally, patient continues to be concerned with mild vaginal discharge that is thin. Some present on exam today. Wet prep normal last week. Recommended and prescribed probiotics, along with increase yogurt consumption.

## 2016-02-29 NOTE — Addendum Note (Signed)
Addended by: Jone BasemanFLEEGER, JESSICA D on: 02/29/2016 03:20 PM   Modules accepted: Orders

## 2016-07-12 ENCOUNTER — Ambulatory Visit (HOSPITAL_COMMUNITY)
Admission: EM | Admit: 2016-07-12 | Discharge: 2016-07-12 | Disposition: A | Discharge status: Home / Self Care | Payer: Medicaid Other | Attending: Family Medicine | Admitting: Family Medicine

## 2016-07-12 ENCOUNTER — Encounter (HOSPITAL_COMMUNITY): Payer: Self-pay | Admitting: Emergency Medicine

## 2016-07-12 DIAGNOSIS — N898 Other specified noninflammatory disorders of vagina: Secondary | ICD-10-CM | POA: Diagnosis not present

## 2016-07-12 NOTE — ED Provider Notes (Addendum)
CSN: [id number]     Arrival date & time 07/12/16  1659 History   None    Chief Complaint  Patient presents with  . Contraception  . Exposure to STD   (Consider location/radiation/quality/duration/timing/severity/associated sxs/prior Treatment) The history is provided by the patient. No language interpreter was used.  Vaginal Discharge  Quality:  White Severity:  Moderate Onset quality:  Gradual Timing:  Constant Progression:  Worsening Chronicity:  New Relieved by:  Nothing Worsened by:  Nothing Ineffective treatments:  None tried Risk factors: no STI exposure    Pt concerned about her mirena being out of place.  Pt reports she has had a vaginal odor.  Pt reports she has always had.  Pt does not think she has any std risk.  Past Medical History:  Diagnosis Date  . Normal pregnancy 10/29/2011  . Normal pregnancy, first 10/28/2011  . SVD (spontaneous vaginal delivery) 10/29/2011   History reviewed. No pertinent surgical history. Family History  Problem Relation Age of Onset  . Hyperlipidemia Mother   . Hypertension Mother   . Stroke Maternal Grandmother   . Hypertension Maternal Grandmother   . Hyperlipidemia Maternal Grandmother    Social History  Substance Use Topics  . Smoking status: Never Smoker  . Smokeless tobacco: Never Used  . Alcohol use No   OB History    Gravida Para Term Preterm AB Living   1 1 1     1    SAB TAB Ectopic Multiple Live Births           1     Review of Systems  Genitourinary: Positive for vaginal discharge.  All other systems reviewed and are negative.   Allergies  Patient has no known allergies.  Home Medications   Prior to Admission medications   Medication Sig Start Date End Date Taking? Authorizing Provider  Probiotic CAPS Take 1 capsule by mouth daily. 02/29/16   Garth Bigness, MD   Meds Ordered and Administered this Visit  Medications - No data to display  BP (!) 111/58 (BP Location: Right Arm) Comment: rn  notified  Pulse 86   Temp 98.7 F (37.1 C) (Oral)   Resp 16   SpO2 100%  No data found.   Physical Exam  Constitutional: She appears well-developed and well-nourished. No distress.  HENT:  Head: Normocephalic and atraumatic.  Eyes: Conjunctivae are normal.  Neck: Neck supple.  Cardiovascular: Normal rate and regular rhythm.   No murmur heard. Pulmonary/Chest: Effort normal and breath sounds normal. No respiratory distress.  Abdominal: Soft. There is no tenderness.  Genitourinary: Vaginal discharge found.  Genitourinary Comments: mirena inplace, adnexa nontender   Musculoskeletal: She exhibits no edema.  Neurological: She is alert.  Skin: Skin is warm and dry.  Psychiatric: She has a normal mood and affect.  Nursing note and vitals reviewed.   Urgent Care Course     Procedures (including critical care time)  Labs Review Labs Reviewed  CERVICOVAGINAL ANCILLARY ONLY    Imaging Review No results found.   Visual Acuity Review  Right Eye Distance:   Left Eye Distance:   Bilateral Distance:    Right Eye Near:   Left Eye Near:    Bilateral Near:         MDM   1. Vaginal discharge    An After Visit Summary was printed and given to the patient.   Pt will await culture results.   Elson Areas, PA-C 07/12/16 1906    Elson Areas,  PA-C 07/12/16 1908

## 2016-07-12 NOTE — ED Triage Notes (Addendum)
Patient concerned for placement of birth control -mirena.   mirena was placed in April 2018 and did not return for follow up.   Patient says she feels like it is poking her depending on movement.   Also asking for std check

## 2016-07-12 NOTE — Discharge Instructions (Signed)
You have labs pending for bacterial vaginitis and std's

## 2016-07-14 LAB — CERVICOVAGINAL ANCILLARY ONLY
Bacterial vaginitis: POSITIVE — AB
CANDIDA VAGINITIS: NEGATIVE
Chlamydia: NEGATIVE
NEISSERIA GONORRHEA: NEGATIVE
TRICH (WINDOWPATH): NEGATIVE

## 2016-07-15 ENCOUNTER — Telehealth (HOSPITAL_COMMUNITY): Payer: Self-pay | Admitting: Internal Medicine

## 2016-07-15 MED ORDER — METRONIDAZOLE 500 MG PO TABS: 500.0000 mg | ORAL_TABLET | Freq: Two times a day (BID) | ORAL | 0 refills | Status: DC

## 2016-07-15 NOTE — Telephone Encounter (Signed)
Please let patient know that test for gardnerella (bacterial vaginosis) was positive.  Will send rx for metronidazole to the pharmacy of record, [facility] [address] [facility] [address] [place].  Recheck for further evaluation if symptoms are not improving.   LM

## 2017-04-23 ENCOUNTER — Other Ambulatory Visit: Payer: Self-pay

## 2017-04-23 ENCOUNTER — Ambulatory Visit (HOSPITAL_COMMUNITY)
Admission: EM | Admit: 2017-04-23 | Discharge: 2017-04-23 | Disposition: A | Discharge status: Home / Self Care | Payer: Self-pay | Attending: Urgent Care | Admitting: Urgent Care

## 2017-04-23 ENCOUNTER — Encounter (HOSPITAL_COMMUNITY): Payer: Self-pay

## 2017-04-23 DIAGNOSIS — Z7251 High risk heterosexual behavior: Secondary | ICD-10-CM

## 2017-04-23 DIAGNOSIS — Z975 Presence of (intrauterine) contraceptive device: Secondary | ICD-10-CM | POA: Insufficient documentation

## 2017-04-23 DIAGNOSIS — Z202 Contact with and (suspected) exposure to infections with a predominantly sexual mode of transmission: Secondary | ICD-10-CM | POA: Insufficient documentation

## 2017-04-23 DIAGNOSIS — Z113 Encounter for screening for infections with a predominantly sexual mode of transmission: Secondary | ICD-10-CM

## 2017-04-23 MED ORDER — AZITHROMYCIN 250 MG PO TABS
1000.0000 mg | ORAL_TABLET | Freq: Once | ORAL | Status: AC
  Administered 2017-04-23: 1000 mg via ORAL

## 2017-04-23 MED ORDER — AZITHROMYCIN 250 MG PO TABS
ORAL_TABLET | ORAL | Status: AC
  Filled 2017-04-23: qty 4

## 2017-04-23 MED ORDER — CEFTRIAXONE SODIUM 250 MG IJ SOLR
250.0000 mg | Freq: Once | INTRAMUSCULAR | Status: AC
  Administered 2017-04-23: 250 mg via INTRAMUSCULAR

## 2017-04-23 MED ORDER — CEFTRIAXONE SODIUM 250 MG IJ SOLR
INTRAMUSCULAR | Status: AC
  Filled 2017-04-23: qty 250

## 2017-04-23 NOTE — ED Provider Notes (Signed)
  MRN: [id number] DOB: 1990-06-25  Subjective:   Sierra Murray is a 27 y.o. female presenting for exposure to gonorrhea.  Patient reports that she had unprotected sex with a female that tested positive for gonorrhea.  Patient is asymptomatic including no fever, nausea, vomiting, vaginal discharge, dysuria, hematuria, urinary frequency, genital rash.  She is agreeable to empiric treatment.  She is not currently taking any medications.   No Known Allergies   Past Medical History:  Diagnosis Date  . Normal pregnancy 10/29/2011  . Normal pregnancy, first 10/28/2011  . SVD (spontaneous vaginal delivery) 10/29/2011     History reviewed. No pertinent surgical history.  Objective:   Vitals: BP 140/81 (BP Location: Left Arm)   Pulse 96   Temp 97.8 F (36.6 C) (Oral)   Resp 17   LMP 04/20/2017 (Exact Date)   SpO2 100%   Physical Exam  Constitutional: She is oriented to person, place, and time. She appears well-developed and well-nourished.  Cardiovascular: Normal rate.  Pulmonary/Chest: Effort normal.  Neurological: She is alert and oriented to person, place, and time.    Assessment and Plan :   Exposure to STD  Exposure to gonorrhea  Unprotected sex  IUD (intrauterine device) in place  We will provide treatment with ceftriaxone and azithromycin.  Labs pending.  Counseled on safe sex practices.  Patient refused a pregnancy test.   Wallis BambergMani, Shaquasha Gerstel, PA-C 04/23/17 1844

## 2017-04-23 NOTE — ED Triage Notes (Signed)
[facility], pt states she may have been exposed to STD

## 2017-04-24 LAB — URINE CYTOLOGY ANCILLARY ONLY
Chlamydia: NEGATIVE
Neisseria Gonorrhea: NEGATIVE
Trichomonas: NEGATIVE

## 2017-04-25 LAB — URINE CYTOLOGY ANCILLARY ONLY: Candida vaginitis: NEGATIVE

## 2017-04-26 ENCOUNTER — Telehealth (HOSPITAL_COMMUNITY): Payer: Self-pay | Admitting: Emergency Medicine

## 2017-04-26 MED ORDER — METRONIDAZOLE 500 MG PO TABS: 500.0000 mg | ORAL_TABLET | Freq: Two times a day (BID) | ORAL | 0 refills | Status: DC

## 2017-10-24 ENCOUNTER — Ambulatory Visit (HOSPITAL_COMMUNITY)
Admission: EM | Admit: 2017-10-24 | Discharge: 2017-10-24 | Disposition: A | Discharge status: Home / Self Care | Payer: Self-pay | Attending: Family Medicine | Admitting: Family Medicine

## 2017-10-24 ENCOUNTER — Encounter (HOSPITAL_COMMUNITY): Payer: Self-pay | Admitting: Emergency Medicine

## 2017-10-24 DIAGNOSIS — Z202 Contact with and (suspected) exposure to infections with a predominantly sexual mode of transmission: Secondary | ICD-10-CM

## 2017-10-24 DIAGNOSIS — Z113 Encounter for screening for infections with a predominantly sexual mode of transmission: Secondary | ICD-10-CM | POA: Insufficient documentation

## 2017-10-24 DIAGNOSIS — Z711 Person with feared health complaint in whom no diagnosis is made: Secondary | ICD-10-CM

## 2017-10-24 NOTE — ED Provider Notes (Signed)
[facility]   [id number] 10/24/17 Arrival Time: 1011   [id number] FOR STD  SUBJECTIVE:  Sierra Murray is a 27 y.o. female who presents requesting STI screening.  Last unprotected sexual encounter 3 weeks ago.  Sexually active with 1 female partner.  States partner has been unfaithful.  Currently asymptomatic.  Denies fever, chills, nausea, vomiting, abdominal or pelvic pain, urinary symptoms, vaginal itching, vaginal odor, vaginal bleeding, dyspareunia, vaginal rashes or lesions.   Patient's last menstrual period was 09/24/2017.  ROS: As per HPI.  Past Medical History:  Diagnosis Date  . Normal pregnancy 10/29/2011  . Normal pregnancy, first 10/28/2011  . SVD (spontaneous vaginal delivery) 10/29/2011   History reviewed. No pertinent surgical history. No Known Allergies No current facility-administered medications on file prior to encounter.    No current outpatient medications on file prior to encounter.   Social History   Socioeconomic History  . Marital status: Single    Spouse name: Not on file  . Number of children: Not on file  . Years of education: Not on file  . Highest education level: Not on file  Occupational History  . Not on file  Social Needs  . Financial resource strain: Not on file  . Food insecurity:    Worry: Not on file    Inability: Not on file  . Transportation needs:    Medical: Not on file    Non-medical: Not on file  Tobacco Use  . Smoking status: Never Smoker  . Smokeless tobacco: Never Used  Substance and Sexual Activity  . Alcohol use: No  . Drug use: No  . Sexual activity: Yes  Lifestyle  . Physical activity:    Days per week: Not on file    Minutes per session: Not on file  . Stress: Not on file  Relationships  . Social connections:    Talks on phone: Not on file    Gets together: Not on file    Attends religious service: Not on file    Active member of club or organization: Not on file    Attends meetings of  clubs or organizations: Not on file    Relationship status: Not on file  . Intimate partner violence:    Fear of current or ex partner: Not on file    Emotionally abused: Not on file    Physically abused: Not on file    Forced sexual activity: Not on file  Other Topics Concern  . Not on file  Social History Narrative  . Not on file   Family History  Problem Relation Age of Onset  . Hyperlipidemia Mother   . Hypertension Mother   . Stroke Maternal Grandmother   . Hypertension Maternal Grandmother   . Hyperlipidemia Maternal Grandmother     OBJECTIVE:  Vitals:   10/24/17 1028  BP: (!) 148/74  Pulse: 100  Resp: 18  Temp: 98.4 F (36.9 C)  SpO2: 100%     General appearance: alert, cooperative, appears stated age and no distress Throat: lips, mucosa, and tongue normal; teeth and gums normal Lungs: CTA bilaterally without adventitious breath sounds Heart: regular rate and rhythm.  Radial pulses 2+ symmetrical bilaterally Back: no CVA tenderness Abdomen: soft, non-tender; bowel sounds normal; no guarding GU: Pt request external examination, but declines bimanual and speculum exam.  External examination without vulvar lesions or erythema, no obvious discharge Skin: warm and dry Psychological:  Alert and cooperative. Normal mood and affect.   ASSESSMENT & PLAN:  1. Concern about STD in female without diagnosis     Pending: Labs Reviewed  CERVICOVAGINAL ANCILLARY ONLY    Declines treatment today.  Would like to wait on results Vaginal self swab obtained Declines HIV/ syphilis testing today We will follow up with you regarding the results of your test If tests are positive, please abstain from sexual activity for at least 7 days and notify partners Follow up with PCP as needed Return here or go to ER if you have any new or worsening symptoms such as vaginal discharge, vaginal odor, vaginal itching, abdominal pain, pelvic pain, nausea, vomiting, etc...  Reviewed  expectations re: course of current medical issues. Questions answered. Outlined signs and symptoms indicating need for more acute intervention. Patient verbalized understanding. After Visit Summary given.       Rennis Harding, PA-C 10/24/17 1103

## 2017-10-24 NOTE — Discharge Instructions (Signed)
Declines treatment today.  Would like to wait on results Vaginal self swab obtained Declines HIV/ syphilis testing today We will follow up with you regarding the results of your test If tests are positive, please abstain from sexual activity for at least 7 days and notify partners Follow up with PCP as needed Return here or go to ER if you have any new or worsening symptoms such as vaginal discharge, vaginal odor, vaginal itching, abdominal pain, pelvic pain, nausea, vomiting, etc..Marland Kitchen

## 2017-10-24 NOTE — ED Triage Notes (Signed)
Pt states she found out her partner was cheating and wants testing for stds.

## 2017-10-25 LAB — CERVICOVAGINAL ANCILLARY ONLY
BACTERIAL VAGINITIS: NEGATIVE
Candida vaginitis: NEGATIVE
Chlamydia: NEGATIVE
Neisseria Gonorrhea: NEGATIVE
TRICH (WINDOWPATH): NEGATIVE

## 2017-10-26 ENCOUNTER — Telehealth (HOSPITAL_COMMUNITY): Payer: Self-pay

## 2018-06-26 ENCOUNTER — Ambulatory Visit (HOSPITAL_COMMUNITY)
Admission: EM | Admit: 2018-06-26 | Discharge: 2018-06-26 | Disposition: A | Discharge status: Home / Self Care | Payer: Self-pay | Attending: Family Medicine | Admitting: Family Medicine

## 2018-06-26 ENCOUNTER — Encounter (HOSPITAL_COMMUNITY): Payer: Self-pay

## 2018-06-26 ENCOUNTER — Other Ambulatory Visit: Payer: Self-pay

## 2018-06-26 ENCOUNTER — Telehealth (HOSPITAL_COMMUNITY): Payer: Self-pay | Admitting: Emergency Medicine

## 2018-06-26 DIAGNOSIS — Z113 Encounter for screening for infections with a predominantly sexual mode of transmission: Secondary | ICD-10-CM | POA: Insufficient documentation

## 2018-06-26 DIAGNOSIS — Z3202 Encounter for pregnancy test, result negative: Secondary | ICD-10-CM

## 2018-06-26 DIAGNOSIS — N3000 Acute cystitis without hematuria: Secondary | ICD-10-CM | POA: Insufficient documentation

## 2018-06-26 LAB — POCT URINALYSIS DIP (DEVICE)
Bilirubin Urine: NEGATIVE
Glucose, UA: NEGATIVE mg/dL
Ketones, ur: NEGATIVE mg/dL
Nitrite: POSITIVE — AB
Protein, ur: 100 mg/dL — AB
Specific Gravity, Urine: 1.02 (ref 1.005–1.030)
Urobilinogen, UA: 2 mg/dL — ABNORMAL HIGH (ref 0.0–1.0)
pH: 8.5 — ABNORMAL HIGH (ref 5.0–8.0)

## 2018-06-26 LAB — POCT PREGNANCY, URINE: Preg Test, Ur: NEGATIVE

## 2018-06-26 MED ORDER — NITROFURANTOIN MONOHYD MACRO 100 MG PO CAPS: 100.0000 mg | ORAL_CAPSULE | Freq: Two times a day (BID) | ORAL | 0 refills | Status: AC

## 2018-06-26 NOTE — ED Provider Notes (Signed)
[facility]    CSN: [id number] Arrival date & time: 06/26/18  1646      History   Chief Complaint Chief Complaint  Patient presents with  . Urinary Tract Infection    HPI Sierra Murray is a 28 y.o. female no significant past medical history presenting today for evaluation of possible UTI.  Patient states that over the past 1 to 2 days she has developed increased frequency, dysuria, urgency and incomplete voiding with urination.  She denies any abnormal vaginal discharge itching or irritation.  She has taken over-the-counter Azo with mild relief of symptoms.  Denies previous history of UTI.  Denies history of yeast or BV.  Patient does wish to proceed with STD testing today as well.  She denies fevers, nausea, vomiting, abdominal pain or back pain.  HPI  Past Medical History:  Diagnosis Date  . Normal pregnancy 10/29/2011  . Normal pregnancy, first 10/28/2011  . SVD (spontaneous vaginal delivery) 10/29/2011    Patient Active Problem List   Diagnosis Date Noted  . Birth control counseling 02/23/2016  . Vaginal discharge 02/23/2016  . Acanthosis nigricans 02/23/2016    History reviewed. No pertinent surgical history.  OB History    Gravida  1   Para  1   Term  1   Preterm      AB      Living  1     SAB      TAB      Ectopic      Multiple      Live Births  1            Home Medications    Prior to Admission medications   Medication Sig Start Date End Date Taking? Authorizing Provider  nitrofurantoin, macrocrystal-monohydrate, (MACROBID) 100 MG capsule Take 1 capsule (100 mg total) by mouth 2 (two) times daily for 5 days. 06/26/18 07/01/18  , Junius CreamerHallie C, PA-C    Family History Family History  Problem Relation Age of Onset  . Hyperlipidemia Mother   . Hypertension Mother   . Stroke Maternal Grandmother   . Hypertension Maternal Grandmother   . Hyperlipidemia Maternal Grandmother     Social History Social History    Tobacco Use  . Smoking status: Never Smoker  . Smokeless tobacco: Never Used  Substance Use Topics  . Alcohol use: No  . Drug use: No     Allergies   Patient has no known allergies.   Review of Systems Review of Systems  Constitutional: Negative for fever.  Respiratory: Negative for shortness of breath.   Cardiovascular: Negative for chest pain.  Gastrointestinal: Negative for abdominal pain, diarrhea, nausea and vomiting.  Genitourinary: Positive for dysuria, frequency and urgency. Negative for flank pain, genital sores, hematuria, menstrual problem, vaginal bleeding, vaginal discharge and vaginal pain.  Musculoskeletal: Negative for back pain.  Skin: Negative for rash.  Neurological: Negative for dizziness, light-headedness and headaches.     Physical Exam Triage Vital Signs ED Triage Vitals  Enc Vitals Group     BP 06/26/18 1720 138/72     Pulse --      Resp 06/26/18 1720 17     Temp 06/26/18 1720 99.3 F (37.4 C)     Temp Source 06/26/18 1720 Oral     SpO2 06/26/18 1720 100 %     Weight --      Height --      Head Circumference --      Peak Flow --  Pain Score 06/26/18 1719 3     Pain Loc --      Pain Edu? --      Excl. in GC? --    No data found.  Updated Vital Signs BP 138/72 (BP Location: Right Arm)   Temp 99.3 F (37.4 C) (Oral)   Resp 17   SpO2 100%   Visual Acuity Right Eye Distance:   Left Eye Distance:   Bilateral Distance:    Right Eye Near:   Left Eye Near:    Bilateral Near:     Physical Exam Vitals signs and nursing note reviewed.  Constitutional:      General: She is not in acute distress.    Appearance: She is well-developed.  HENT:     Head: Normocephalic and atraumatic.  Eyes:     Conjunctiva/sclera: Conjunctivae normal.  Neck:     Musculoskeletal: Neck supple.  Cardiovascular:     Rate and Rhythm: Normal rate and regular rhythm.     Heart sounds: No murmur.  Pulmonary:     Effort: Pulmonary effort is normal. No  respiratory distress.     Breath sounds: Normal breath sounds.  Abdominal:     Palpations: Abdomen is soft.     Tenderness: There is no abdominal tenderness.     Comments: Soft, nondistended, nontender to light deep palpation throughout entire abdomen No CVA tenderness  Skin:    General: Skin is warm and dry.  Neurological:     Mental Status: She is alert.      UC Treatments / Results  Labs (all labs ordered are listed, but only abnormal results are displayed) Labs Reviewed  POCT URINALYSIS DIP (DEVICE) - Abnormal; Notable for the following components:      Result Value   Hgb urine dipstick MODERATE (*)    pH 8.5 (*)    Protein, ur 100 (*)    Urobilinogen, UA 2.0 (*)    Nitrite POSITIVE (*)    Leukocytes,Ua LARGE (*)    All other components within normal limits  URINE CULTURE  POC URINE PREG, ED  POCT PREGNANCY, URINE  CERVICOVAGINAL ANCILLARY ONLY    EKG None  Radiology No results found.  Procedures Procedures (including critical care time)  Medications Ordered in UC Medications - No data to display  Initial Impression / Assessment and Plan / UC Course  I have reviewed the triage vital signs and the nursing notes.  Pertinent labs & imaging results that were available during my care of the patient were reviewed by me and considered in my medical decision making (see chart for details).    Large leuks, positive nitrites, will treat for UTI with Macrobid twice daily x5 days.  Vital signs stable, no CVA tenderness, do not suspect pyelonephritis at this time.  Urine culture obtained and will send off to check sensitivities.  Vaginal swab obtained which will use to screen for GC/chlamydia/trichomonas.  Will call with results and provide further treatment if needed.Discussed strict return precautions. Patient verbalized understanding and is agreeable with plan.  Final Clinical Impressions(s) / UC Diagnoses   Final diagnoses:  Acute cystitis without hematuria   Screen for STD (sexually transmitted disease)     Discharge Instructions     Urine showed evidence of infection. We are treating you with macrobid twice daily for 5 days. Be sure to take full course. Stay hydrated- urine should be pale yellow to clear. May continue azo for relief of burning while infection is being cleared.  Please return or follow up with your primary provider if symptoms not improving with treatment. Please return sooner if you have worsening of symptoms or develop fever, nausea, vomiting, abdominal pain, back pain, lightheadedness, dizziness.   ED Prescriptions    Medication Sig Dispense Auth. Provider   nitrofurantoin, macrocrystal-monohydrate, (MACROBID) 100 MG capsule Take 1 capsule (100 mg total) by mouth 2 (two) times daily for 5 days. 10 capsule ,  C, PA-C     Controlled Substance Prescriptions [place] Controlled Substance Registry consulted? Not Applicable   Shelton Silvas 06/26/18 1835

## 2018-06-26 NOTE — ED Triage Notes (Signed)
Patient presents to Urgent Care with complaints of frequent and burning urination since yesterday. Patient reports she thinks she has a UTI.

## 2018-06-26 NOTE — Telephone Encounter (Signed)
Error

## 2018-06-26 NOTE — Discharge Instructions (Signed)
Urine showed evidence of infection. We are treating you with macrobid- twice daily for 5 days. Be sure to take full course. Stay hydrated- urine should be pale yellow to clear. May continue azo for relief of burning while infection is being cleared.   Please return or follow up with your primary provider if symptoms not improving with treatment. Please return sooner if you have worsening of symptoms or develop fever, nausea, vomiting, abdominal pain, back pain, lightheadedness, dizziness. 

## 2018-06-26 NOTE — ED Notes (Signed)
Urine culture and Cervicovaginal swab sent to main lab on 06/26/18 TB

## 2018-06-27 LAB — CERVICOVAGINAL ANCILLARY ONLY
Chlamydia: NEGATIVE
Neisseria Gonorrhea: NEGATIVE
Trichomonas: NEGATIVE

## 2018-06-28 LAB — URINE CULTURE: Culture: >=100000 — AB

## 2018-06-29 ENCOUNTER — Telehealth (HOSPITAL_COMMUNITY): Payer: Self-pay | Admitting: Emergency Medicine

## 2018-06-29 NOTE — Telephone Encounter (Signed)
Urine culture was positive for e coli and was given  macrobid at urgent care visit. Attempted to reach patient. No answer at this time.   

## 2018-09-03 ENCOUNTER — Ambulatory Visit (HOSPITAL_COMMUNITY)
Admission: EM | Admit: 2018-09-03 | Discharge: 2018-09-03 | Disposition: A | Discharge status: Home / Self Care | Payer: Self-pay | Attending: Family Medicine | Admitting: Family Medicine

## 2018-09-03 ENCOUNTER — Encounter (HOSPITAL_COMMUNITY): Payer: Self-pay | Admitting: Family Medicine

## 2018-09-03 ENCOUNTER — Other Ambulatory Visit: Payer: Self-pay

## 2018-09-03 DIAGNOSIS — Z20828 Contact with and (suspected) exposure to other viral communicable diseases: Secondary | ICD-10-CM | POA: Insufficient documentation

## 2018-09-03 DIAGNOSIS — J029 Acute pharyngitis, unspecified: Secondary | ICD-10-CM | POA: Insufficient documentation

## 2018-09-03 DIAGNOSIS — H6690 Otitis media, unspecified, unspecified ear: Secondary | ICD-10-CM | POA: Insufficient documentation

## 2018-09-03 LAB — POCT RAPID STREP A: Streptococcus, Group A Screen (Direct): NEGATIVE

## 2018-09-03 MED ORDER — IBUPROFEN 600 MG PO TABS: 600.0000 mg | ORAL_TABLET | Freq: Four times a day (QID) | ORAL | 0 refills | Status: DC | PRN

## 2018-09-03 MED ORDER — AMOXICILLIN 500 MG PO CAPS: 1000.0000 mg | ORAL_CAPSULE | Freq: Three times a day (TID) | ORAL | 0 refills | Status: AC

## 2018-09-03 NOTE — ED Triage Notes (Signed)
Pt sts sore throat with pain into ears x 3 days; pt denies fever or cough; pt requests covid testing

## 2018-09-03 NOTE — ED Provider Notes (Signed)
[place]    CSN: [id number] Arrival date & time: 09/03/18  1102      History   Chief Complaint Chief Complaint  Patient presents with  . Sore Throat  . Otalgia    HPI Sierra Murray is a 28 y.o. female.   Patient is a 28 year old female presents today with sore throat and right ear pain x3 days. Mild pain with swallowing but no trouble swallowing.  Symptoms have been constant, waxing waning.  She has been taking Tylenol with some relief.  Denies any fever, cough, chest congestion. No known sick contacts or COVID exposures. Would like to be COVID tested.   ROS per HPI      Past Medical History:  Diagnosis Date  . Normal pregnancy 10/29/2011  . Normal pregnancy, first 10/28/2011  . SVD (spontaneous vaginal delivery) 10/29/2011    Patient Active Problem List   Diagnosis Date Noted  . Birth control counseling 02/23/2016  . Vaginal discharge 02/23/2016  . Acanthosis nigricans 02/23/2016    History reviewed. No pertinent surgical history.  OB History    Gravida  1   Para  1   Term  1   Preterm      AB      Living  1     SAB      TAB      Ectopic      Multiple      Live Births  1            Home Medications    Prior to Admission medications   Medication Sig Start Date End Date Taking? Authorizing Provider  amoxicillin (AMOXIL) 500 MG capsule Take 2 capsules (1,000 mg total) by mouth 3 (three) times daily for 5 days. 09/03/18 09/08/18  Loura Halt A, NP  ibuprofen (ADVIL) 600 MG tablet Take 1 tablet (600 mg total) by mouth every 6 (six) hours as needed. 09/03/18   Orvan July, NP    Family History Family History  Problem Relation Age of Onset  . Hyperlipidemia Mother   . Hypertension Mother   . Stroke Maternal Grandmother   . Hypertension Maternal Grandmother   . Hyperlipidemia Maternal Grandmother     Social History Social History   Tobacco Use  . Smoking status: Never Smoker  . Smokeless tobacco: Never Used   Substance Use Topics  . Alcohol use: No  . Drug use: No     Allergies   Patient has no known allergies.   Review of Systems Review of Systems   Physical Exam Triage Vital Signs ED Triage Vitals [09/03/18 1215]  Enc Vitals Group     BP (!) 142/81     Pulse Rate 96     Resp 18     Temp 98.4 F (36.9 C)     Temp Source Oral     SpO2 100 %     Weight      Height      Head Circumference      Peak Flow      Pain Score 7     Pain Loc      Pain Edu?      Excl. in [place]?    No data found.  Updated Vital Signs BP (!) 142/81 (BP Location: Left Arm)   Pulse 96   Temp 98.4 F (36.9 C) (Oral)   Resp 18   SpO2 100%   Visual Acuity Right Eye Distance:   Left Eye Distance:  Bilateral Distance:    Right Eye Near:   Left Eye Near:    Bilateral Near:     Physical Exam Vitals signs and nursing note reviewed.  Constitutional:      General: She is not in acute distress.    Appearance: She is well-developed. She is not ill-appearing, toxic-appearing or diaphoretic.  HENT:     Head: Normocephalic and atraumatic.     Right Ear: Tympanic membrane is erythematous.     Left Ear: Tympanic membrane and ear canal normal.     Nose: Congestion and rhinorrhea present.     Mouth/Throat:     Mouth: Mucous membranes are moist.     Pharynx: Posterior oropharyngeal erythema and uvula swelling present.     Tonsils: No tonsillar exudate. 1+ on the right. 1+ on the left.  Eyes:     Conjunctiva/sclera: Conjunctivae normal.  Cardiovascular:     Rate and Rhythm: Normal rate and regular rhythm.     Heart sounds: Normal heart sounds.  Pulmonary:     Effort: Pulmonary effort is normal.     Breath sounds: Normal breath sounds.  Lymphadenopathy:     Cervical: Cervical adenopathy present.  Skin:    General: Skin is warm and dry.     Findings: No rash.  Neurological:     Mental Status: She is alert.  Psychiatric:        Mood and Affect: Mood normal.      UC Treatments / Results   Labs (all labs ordered are listed, but only abnormal results are displayed) Labs Reviewed  NOVEL CORONAVIRUS, NAA (HOSP ORDER, SEND-OUT TO REF LAB; TAT 18-24 HRS)  CULTURE, GROUP A STREP [facility])  POCT RAPID STREP A    EKG   Radiology No results found.  Procedures Procedures (including critical care time)  Medications Ordered in UC Medications - No data to display  Initial Impression / Assessment and Plan / UC Course  I have reviewed the triage vital signs and the nursing notes.  Pertinent labs & imaging results that were available during my care of the patient were reviewed by me and considered in my medical decision making (see chart for details).     Rapid strep test negative Right erythematous TM. We will treat for otitis media with amoxicillin Ibuprofen every 6 hours for pain and inflammation Follow up as needed for continued or worsening symptoms     Final Clinical Impressions(s) / UC Diagnoses   Final diagnoses:  Acute otitis media, unspecified otitis media type     Discharge Instructions     Your strep test was negative I believe your symptoms are related to a ear infection in the right ear. We will treat this with amoxicillin 3 times a day for 5 days.  You can take ibuprofen 600 mg every 6-8 hours for pain and inflammation I do not believe this is COVID related.  We will send the swab for testing in case Follow up as needed for continued or worsening symptoms      ED Prescriptions    Medication Sig Dispense Auth. Provider   amoxicillin (AMOXIL) 500 MG capsule Take 2 capsules (1,000 mg total) by mouth 3 (three) times daily for 5 days. 30 capsule Lima Chillemi A, NP   ibuprofen (ADVIL) 600 MG tablet Take 1 tablet (600 mg total) by mouth every 6 (six) hours as needed. 30 tablet Dahlia Byes A, NP     Controlled Substance Prescriptions [place] Controlled Substance Registry consulted? Not Applicable  Janace ArisBast, [place] A, NP 09/03/18 1357

## 2018-09-03 NOTE — Discharge Instructions (Addendum)
Your strep test was negative I believe your symptoms are related to a ear infection in the right ear. We will treat this with amoxicillin 3 times a day for 5 days.  You can take ibuprofen 600 mg every 6-8 hours for pain and inflammation I do not believe this is COVID related.  We will send the swab for testing in case Follow up as needed for continued or worsening symptoms

## 2018-09-04 LAB — NOVEL CORONAVIRUS, NAA (HOSP ORDER, SEND-OUT TO REF LAB; TAT 18-24 HRS): SARS-CoV-2, NAA: NOT DETECTED

## 2018-09-05 ENCOUNTER — Encounter (HOSPITAL_COMMUNITY): Payer: Self-pay

## 2018-09-05 LAB — CULTURE, GROUP A STREP (THRC)

## 2019-01-09 ENCOUNTER — Other Ambulatory Visit: Payer: Self-pay

## 2019-01-09 ENCOUNTER — Encounter (HOSPITAL_COMMUNITY): Payer: Self-pay

## 2019-01-09 ENCOUNTER — Ambulatory Visit (HOSPITAL_COMMUNITY)
Admission: EM | Admit: 2019-01-09 | Discharge: 2019-01-09 | Disposition: A | Discharge status: Home / Self Care | Payer: HRSA Program | Attending: Urgent Care | Admitting: Urgent Care

## 2019-01-09 DIAGNOSIS — Z20822 Contact with and (suspected) exposure to covid-19: Secondary | ICD-10-CM | POA: Insufficient documentation

## 2019-01-09 NOTE — ED Provider Notes (Signed)
[facility]    CSN: [id number] Arrival date & time: 01/09/19  9937      History   Chief Complaint Chief Complaint  Patient presents with  . covid test    HPI Sierra Murray is a 29 y.o. female.   Patient is a 29 year old female who presents today for Covid testing.  Reporting she was exposed by her boyfriend's daughter.  She is currently not having any symptoms.  She needs a note for work     Past Medical History:  Diagnosis Date  . Normal pregnancy 10/29/2011  . Normal pregnancy, first 10/28/2011  . SVD (spontaneous vaginal delivery) 10/29/2011    Patient Active Problem List   Diagnosis Date Noted  . Birth control counseling 02/23/2016  . Vaginal discharge 02/23/2016  . Acanthosis nigricans 02/23/2016    History reviewed. No pertinent surgical history.  OB History    Gravida  1   Para  1   Term  1   Preterm      AB      Living  1     SAB      TAB      Ectopic      Multiple      Live Births  1            Home Medications    Prior to Admission medications   Medication Sig Start Date End Date Taking? Authorizing Provider  ibuprofen (ADVIL) 600 MG tablet Take 1 tablet (600 mg total) by mouth every 6 (six) hours as needed. 09/03/18   Janace Aris, NP    Family History Family History  Problem Relation Age of Onset  . Hyperlipidemia Mother   . Hypertension Mother   . Stroke Maternal Grandmother   . Hypertension Maternal Grandmother   . Hyperlipidemia Maternal Grandmother     Social History Social History   Tobacco Use  . Smoking status: Never Smoker  . Smokeless tobacco: Never Used  Substance Use Topics  . Alcohol use: No  . Drug use: No     Allergies   Patient has no known allergies.   Review of Systems Review of Systems  Constitutional: Negative for chills and fever.  HENT: Negative for ear pain and sore throat.   Eyes: Negative for pain and visual disturbance.  Respiratory: Negative for cough and  shortness of breath.   Cardiovascular: Negative for chest pain and palpitations.  Gastrointestinal: Negative for abdominal pain and vomiting.  Genitourinary: Negative for dysuria and hematuria.  Musculoskeletal: Negative for arthralgias and back pain.  Skin: Negative for color change and rash.  Neurological: Negative for seizures and syncope.  All other systems reviewed and are negative.    Physical Exam Triage Vital Signs ED Triage Vitals [01/09/19 0843]  Enc Vitals Group     BP (!) 142/82     Pulse Rate 100     Resp 16     Temp 98.9 F (37.2 C)     Temp src      SpO2 100 %     Weight      Height      Head Circumference      Peak Flow      Pain Score      Pain Loc      Pain Edu?      Excl. in [place]?    No data found.  Updated Vital Signs BP (!) 142/82 (BP Location: Right Arm)   Pulse 100  Temp 98.9 F (37.2 C)   Resp 16   LMP 01/09/2019   SpO2 100%   Visual Acuity Right Eye Distance:   Left Eye Distance:   Bilateral Distance:    Right Eye Near:   Left Eye Near:    Bilateral Near:     Physical Exam   UC Treatments / Results  Labs (all labs ordered are listed, but only abnormal results are displayed) Labs Reviewed  NOVEL CORONAVIRUS, NAA (HOSP ORDER, SEND-OUT TO REF LAB; TAT 18-24 HRS)    EKG   Radiology No results found.  Procedures Procedures (including critical care time)  Medications Ordered in UC Medications - No data to display  Initial Impression / Assessment and Plan / UC Course  I have reviewed the triage vital signs and the nursing notes.  Pertinent labs & imaging results that were available during my care of the patient were reviewed by me and considered in my medical decision making (see chart for details).     Covid testing-swab sent with labs pending. Precautions and work note given Final Clinical Impressions(s) / UC Diagnoses   Final diagnoses:  Encounter for laboratory testing for COVID-19 virus     Discharge  Instructions     We will call you if the covid test is positive.  Work note given     ED Prescriptions    None     PDMP not reviewed this encounter.   Orvan July, NP 01/09/19 [phone]

## 2019-01-09 NOTE — ED Triage Notes (Signed)
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 Pt states she just wanted to get tested for Covid. Pt states she needs a work note.

## 2019-01-09 NOTE — Discharge Instructions (Addendum)
We will call you if the covid test is positive.  Work note given

## 2019-01-11 LAB — NOVEL CORONAVIRUS, NAA (HOSP ORDER, SEND-OUT TO REF LAB; TAT 18-24 HRS): SARS-CoV-2, NAA: NOT DETECTED

## 2019-01-16 ENCOUNTER — Other Ambulatory Visit: Payer: Self-pay

## 2020-01-30 ENCOUNTER — Ambulatory Visit (HOSPITAL_COMMUNITY)
Admission: EM | Admit: 2020-01-30 | Discharge: 2020-01-30 | Disposition: A | Discharge status: Home / Self Care | Payer: HRSA Program | Attending: Family Medicine | Admitting: Family Medicine

## 2020-01-30 ENCOUNTER — Other Ambulatory Visit: Payer: Self-pay

## 2020-01-30 ENCOUNTER — Encounter (HOSPITAL_COMMUNITY): Payer: Self-pay

## 2020-01-30 DIAGNOSIS — U071 COVID-19: Secondary | ICD-10-CM | POA: Insufficient documentation

## 2020-01-30 DIAGNOSIS — J029 Acute pharyngitis, unspecified: Secondary | ICD-10-CM | POA: Diagnosis present

## 2020-01-30 DIAGNOSIS — N898 Other specified noninflammatory disorders of vagina: Secondary | ICD-10-CM | POA: Diagnosis not present

## 2020-01-30 DIAGNOSIS — J069 Acute upper respiratory infection, unspecified: Secondary | ICD-10-CM

## 2020-01-30 LAB — SARS CORONAVIRUS 2 (TAT 6-24 HRS): SARS Coronavirus 2: POSITIVE — AB

## 2020-01-30 MED ORDER — PROMETHAZINE-DM 6.25-15 MG/5ML PO SYRP: 5.0000 mL | ORAL_SOLUTION | Freq: Four times a day (QID) | ORAL | 0 refills | Status: DC | PRN

## 2020-01-30 NOTE — ED Provider Notes (Signed)
[facility]    CSN: [id number] Arrival date & time: 01/30/20  0813      History   Chief Complaint Chief Complaint  Patient presents with   Cough   Sore Throat   Nasal Congestion   Vaginal Discharge    HPI Sierra Murray is a 30 y.o. female.   Here today with 3 day history of mild sore throat, nasal drainage, mild cough. Denies fever, chills, CP, SOB, wheezing, N/V/D. Has been taking dayquil with some relief. Father was sick a week or so ago, unsure with what. No known chronic medical problems. Also having vaginal discharge for about a week now, no itching, vaginal or pelvic pain, dysuria, hematuria, concern for STIs. LMP 01/22/20. Not trying anything OTC for sxs.      Past Medical History:  Diagnosis Date   Normal pregnancy 10/29/2011   Normal pregnancy, first 10/28/2011   SVD (spontaneous vaginal delivery) 10/29/2011    Patient Active Problem List   Diagnosis Date Noted   Birth control counseling 02/23/2016   Vaginal discharge 02/23/2016   Acanthosis nigricans 02/23/2016    History reviewed. No pertinent surgical history.  OB History    Gravida  1   Para  1   Term  1   Preterm      AB      Living  1     SAB      IAB      Ectopic      Multiple      Live Births  1            Home Medications    Prior to Admission medications   Medication Sig Start Date End Date Taking? Authorizing Provider  promethazine-dextromethorphan (PROMETHAZINE-DM) 6.25-15 MG/5ML syrup Take 5 mLs by mouth 4 (four) times daily as needed for cough. 01/30/20  Yes Particia Nearing, PA-C  ibuprofen (ADVIL) 600 MG tablet Take 1 tablet (600 mg total) by mouth every 6 (six) hours as needed. 09/03/18   Janace Aris, NP    Family History Family History  Problem Relation Age of Onset   Hyperlipidemia Mother    Hypertension Mother    Stroke Maternal Grandmother    Hypertension Maternal Grandmother    Hyperlipidemia Maternal Grandmother      Social History Social History   Tobacco Use   Smoking status: Never Smoker   Smokeless tobacco: Never Used  Building services engineer Use: Never used  Substance Use Topics   Alcohol use: No   Drug use: No     Allergies   Patient has no known allergies.   Review of Systems Review of Systems PER HPI   Physical Exam Triage Vital Signs ED Triage Vitals  Enc Vitals Group     BP 01/30/20 0827 (!) 145/96     Pulse Rate 01/30/20 0827 100     Resp 01/30/20 0827 19     Temp 01/30/20 0827 98.6 F (37 C)     Temp src --      SpO2 01/30/20 0827 99 %     Weight --      Height --      Head Circumference --      Peak Flow --      Pain Score 01/30/20 0825 0     Pain Loc --      Pain Edu? --      Excl. in [place]? --    No data found.  Updated Vital  Signs BP (!) 145/96    Pulse 100    Temp 98.6 F (37 C)    Resp 19    LMP 01/22/2020 (Exact Date)    SpO2 99%   Visual Acuity Right Eye Distance:   Left Eye Distance:   Bilateral Distance:    Right Eye Near:   Left Eye Near:    Bilateral Near:     Physical Exam Vitals and nursing note reviewed.  Constitutional:      Appearance: Normal appearance. She is not ill-appearing.  HENT:     Head: Atraumatic.     Right Ear: Tympanic membrane normal.     Left Ear: Tympanic membrane normal.     Nose: Rhinorrhea present.     Mouth/Throat:     Mouth: Mucous membranes are moist.     Pharynx: Posterior oropharyngeal erythema present. No oropharyngeal exudate.  Eyes:     Extraocular Movements: Extraocular movements intact.     Conjunctiva/sclera: Conjunctivae normal.  Cardiovascular:     Rate and Rhythm: Normal rate and regular rhythm.     Heart sounds: Normal heart sounds.  Pulmonary:     Effort: Pulmonary effort is normal. No respiratory distress.     Breath sounds: Normal breath sounds. No wheezing or rales.  Genitourinary:    Comments: GU exam deferred, self swab performed Musculoskeletal:        General: Normal range  of motion.     Cervical back: Normal range of motion and neck supple.  Skin:    General: Skin is warm and dry.     Findings: No rash.  Neurological:     Mental Status: She is alert and oriented to person, place, and time.     Motor: No weakness.     Gait: Gait normal.  Psychiatric:        Mood and Affect: Mood normal.        Thought Content: Thought content normal.        Judgment: Judgment normal.      UC Treatments / Results  Labs (all labs ordered are listed, but only abnormal results are displayed) Labs Reviewed  SARS CORONAVIRUS 2 (TAT 6-24 HRS)  CERVICOVAGINAL ANCILLARY ONLY    EKG   Radiology No results found.  Procedures Procedures (including critical care time)  Medications Ordered in UC Medications - No data to display  Initial Impression / Assessment and Plan / UC Course  I have reviewed the triage vital signs and the nursing notes.  Pertinent labs & imaging results that were available during my care of the patient were reviewed by me and considered in my medical decision making (see chart for details).     Exam and vitals reviewed, COVID pcr pending. Phenergan DM sent in case cough worsening, continue OTC remedies, supportive care. Return for acutely worsening sxs. Aptima swab pending to evaluate the discharge. Abstinence until results return reviewed. Treat based on results.   Final Clinical Impressions(s) / UC Diagnoses   Final diagnoses:  Viral URI with cough  Vaginal discharge   Discharge Instructions   None    ED Prescriptions    Medication Sig Dispense Auth. Provider   promethazine-dextromethorphan (PROMETHAZINE-DM) 6.25-15 MG/5ML syrup Take 5 mLs by mouth 4 (four) times daily as needed for cough. 100 mL Particia Nearing, [place]     PDMP not reviewed this encounter.   Roosvelt Maser [place], [place] 01/30/20 [phone]

## 2020-01-30 NOTE — ED Triage Notes (Signed)
Pt in with c/o cough, ST, and congestion that has been going on since Monday  Pt took tylenol cold and flu medicine for sxs  Also c/o vaginal discharge that she noticed last week

## 2020-01-31 ENCOUNTER — Telehealth (HOSPITAL_COMMUNITY): Payer: Self-pay | Admitting: Family

## 2020-01-31 NOTE — Telephone Encounter (Signed)
Called to discuss with Sierra Murray about Covid symptoms and the use of casirivimab/imdevimab, a combination monoclonal antibody infusion for those with mild to moderate Covid symptoms and at a high risk of hospitalization.     Pt is qualified for this infusion at the [facility] infusion center due to co-morbid conditions and/or a member of an at-risk group, however declines infusion at this time as she "feels fine" . Symptoms tier reviewed. Symptoms reviewed that would warrant ED/Hospital evaluation. Preventative practices reviewed. Patient verbalized understanding.       Patient Active Problem List   Diagnosis Date Noted  . Birth control counseling 02/23/2016  . Vaginal discharge 02/23/2016  . Acanthosis nigricans 02/23/2016    Sierra Kattner,NP

## 2020-02-04 ENCOUNTER — Telehealth (HOSPITAL_COMMUNITY): Payer: Self-pay | Admitting: Emergency Medicine

## 2020-02-04 LAB — CERVICOVAGINAL ANCILLARY ONLY
Bacterial Vaginitis (gardnerella): POSITIVE — AB
Candida Glabrata: NEGATIVE
Candida Vaginitis: NEGATIVE
Chlamydia: NEGATIVE
Comment: NEGATIVE
Comment: NEGATIVE
Comment: NEGATIVE
Comment: NEGATIVE
Comment: NEGATIVE
Comment: NORMAL
Neisseria Gonorrhea: NEGATIVE
Trichomonas: NEGATIVE

## 2020-02-04 MED ORDER — METRONIDAZOLE 500 MG PO TABS: 500.0000 mg | ORAL_TABLET | Freq: Two times a day (BID) | ORAL | 0 refills | Status: DC

## 2021-05-23 ENCOUNTER — Other Ambulatory Visit: Payer: Self-pay

## 2021-05-23 ENCOUNTER — Emergency Department (HOSPITAL_BASED_OUTPATIENT_CLINIC_OR_DEPARTMENT_OTHER)
Admission: EM | Admit: 2021-05-23 | Discharge: 2021-05-23 | Disposition: A | Discharge status: Home / Self Care | Payer: Medicaid Other | Attending: Emergency Medicine | Admitting: Emergency Medicine

## 2021-05-23 DIAGNOSIS — J029 Acute pharyngitis, unspecified: Secondary | ICD-10-CM | POA: Diagnosis present

## 2021-05-23 DIAGNOSIS — J020 Streptococcal pharyngitis: Secondary | ICD-10-CM | POA: Diagnosis not present

## 2021-05-23 LAB — GROUP A STREP BY PCR: Group A Strep by PCR: DETECTED — AB

## 2021-05-23 MED ORDER — PENICILLIN G BENZATHINE 1200000 UNIT/2ML IM SUSY
1.2000 10*6.[IU] | PREFILLED_SYRINGE | Freq: Once | INTRAMUSCULAR | Status: AC
  Administered 2021-05-23: 1.2 10*6.[IU] via INTRAMUSCULAR
  Filled 2021-05-23: qty 2

## 2021-05-23 MED ORDER — DEXAMETHASONE SODIUM PHOSPHATE 10 MG/ML IJ SOLN
10.0000 mg | Freq: Once | INTRAMUSCULAR | Status: AC
  Administered 2021-05-23: 10 mg via INTRAMUSCULAR
  Filled 2021-05-23: qty 1

## 2021-05-23 MED ORDER — IBUPROFEN 800 MG PO TABS
800.0000 mg | ORAL_TABLET | Freq: Once | ORAL | Status: AC
  Administered 2021-05-23: 800 mg via ORAL
  Filled 2021-05-23: qty 1

## 2021-05-23 NOTE — ED Triage Notes (Signed)
POV, pt sts sore throat began yesterday, daughter tested positive for strep a couple days ago, able to speak and swallow but painful.

## 2021-05-23 NOTE — ED Provider Notes (Signed)
[facility]   CSN: [id number] Arrival date & time: 05/23/21  [id number]     History  Chief Complaint  Patient presents with   Sore Throat    Sierra Murray is a 31 y.o. female.  The history is provided by the patient and medical records. No language interpreter was used.  Sore Throat This is a new problem. The current episode started 2 days ago. The problem occurs constantly. The problem has not changed since onset.Pertinent negatives include no chest pain, no abdominal pain, no headaches and no shortness of breath. The symptoms are aggravated by swallowing. Nothing relieves the symptoms. She has tried nothing for the symptoms. The treatment provided no relief.      Home Medications Prior to Admission medications   Medication Sig Start Date End Date Taking? Authorizing Provider  ibuprofen (ADVIL) 600 MG tablet Take 1 tablet (600 mg total) by mouth every 6 (six) hours as needed. 09/03/18   Dahlia ByesBast, Traci A, NP  metroNIDAZOLE (FLAGYL) 500 MG tablet Take 1 tablet (500 mg total) by mouth 2 (two) times daily. 02/04/20   Merrilee JanskyLamptey, Philip O, MD  promethazine-dextromethorphan (PROMETHAZINE-DM) 6.25-15 MG/5ML syrup Take 5 mLs by mouth 4 (four) times daily as needed for cough. 01/30/20   Particia NearingLane, Rachel Elizabeth, PA-C      Allergies    Patient has no known allergies.    Review of Systems   Review of Systems  Constitutional:  Positive for chills and fever. Negative for fatigue.  HENT:  Positive for congestion and sore throat. Negative for trouble swallowing.        Pain but not diffuculty swallowing   Respiratory:  Negative for cough, chest tightness, shortness of breath and wheezing.   Cardiovascular:  Negative for chest pain, palpitations and leg swelling.  Gastrointestinal:  Negative for abdominal pain, constipation, diarrhea, nausea and vomiting.  Genitourinary:  Negative for dysuria.  Musculoskeletal:  Negative for back pain, neck pain and neck  stiffness.  Skin:  Negative for rash and wound.  Neurological:  Negative for light-headedness, numbness and headaches.  Psychiatric/Behavioral:  Negative for agitation.   All other systems reviewed and are negative.  Physical Exam Updated Vital Signs BP (!) 142/82 (BP Location: Right Arm)   Pulse (!) 112   Temp 100 F (37.8 C) (Oral)   Resp 14   Ht 5\' 5"  (1.651 m)   Wt 91.2 kg   SpO2 100%   BMI 33.45 kg/m  Physical Exam Vitals and nursing note reviewed.  Constitutional:      General: She is not in acute distress.    Appearance: She is well-developed. She is not ill-appearing, toxic-appearing or diaphoretic.  HENT:     Head: Atraumatic.     Nose: Congestion and rhinorrhea present.     Mouth/Throat:     Pharynx: Uvula midline. Posterior oropharyngeal erythema present. No pharyngeal swelling, oropharyngeal exudate or uvula swelling.     Tonsils: No tonsillar exudate or tonsillar abscesses.  Eyes:     Conjunctiva/sclera: Conjunctivae normal.  Cardiovascular:     Rate and Rhythm: Regular rhythm. Tachycardia present.     Heart sounds: No murmur heard. Pulmonary:     Effort: Pulmonary effort is normal. No respiratory distress.     Breath sounds: Normal breath sounds. No wheezing, rhonchi or rales.  Chest:     Chest wall: No tenderness.  Abdominal:     Palpations: Abdomen is soft.     Tenderness: There is no abdominal tenderness.  Musculoskeletal:        General: No swelling.     Cervical back: Normal range of motion and neck supple.  Skin:    General: Skin is warm and dry.     Capillary Refill: Capillary refill takes less than 2 seconds.  Neurological:     Mental Status: She is alert.  Psychiatric:        Mood and Affect: Mood normal.    ED Results / Procedures / Treatments   Labs (all labs ordered are listed, but only abnormal results are displayed) Labs Reviewed  GROUP A STREP BY PCR - Abnormal; Notable for the following components:      Result Value   Group A  Strep by PCR DETECTED (*)    All other components within normal limits    EKG None  Radiology No results found.  Procedures Procedures    Medications Ordered in ED Medications  dexamethasone (DECADRON) injection 10 mg (has no administration in time range)  penicillin g benzathine (BICILLIN LA) 1200000 UNIT/2ML injection 1.2 Million Units (has no administration in time range)    ED Course/ Medical Decision Making/ A&P                           Medical Decision Making Risk Prescription drug management.   Sierra Murray is a 31 y.o. female with no significant past medical history who presents with sore throat.  According to patient, her daughter tested positive yesterday for strep throat.  Patient has had several days of symptoms and has pain with swallowing.  She does not have difficulty swallowing or breathing at this time.  Denies any chest pain, shortness of breath, cough, nausea, vomiting, constipation, diarrhea, or urinary changes.  Denies rashes.  Denies difficulty moving her neck.  Denies significant headache or any neurologic complaints.  She does have fevers and chills and is febrile on arrival.  She has never had this before and has no history of other neck infection such as Ludewig's angina, peritonsillar abscess, or retropharyngeal abscess.  Patient otherwise resting.  On exam, lungs clear and chest nontender.  Abdomen nontender.  Neck nontender.  Normal range of motion.  Oropharyngeal exam did not show evidence of PTA or RPA but did show some erythema in her oropharynx.  Uvula was midline.  Exam otherwise unremarkable.  No stridor.  Given the daughter's symptoms and positive strep test I suspect patient is strep throat.  Labs were ordered and interpreted by me with a positive strep test.  Given her lack of evidence of PTA or RPA and her ability to breathe and eat and drink, we agreed together to hold on imaging or more extensive labs.  We will give the patient dose of  Decadron IM to help with the inflammation and will give her a dose of penicillin to treat with antibiotics.  Patient prefer the shot of her pills.  Patient was over-the-counter medication and will rest and stay hydrated at home.  She reports she is off the next 2 days and will rest.  She understood return precautions and follow-up instructions and had no other questions or concerns.  Patient discharged in good condition after p.o. challenge after medications.          Final Clinical Impression(s) / ED Diagnoses Final diagnoses:  Strep pharyngitis    Rx / DC Orders ED Discharge Orders     None       Clinical Impression: 1.  Strep pharyngitis     Disposition: Discharge  Condition: Good  I have discussed the results, Dx and Tx plan with the pt(& family if present). He/she/they expressed understanding and agree(s) with the plan. Discharge instructions discussed at great length. Strict return precautions discussed and pt &/or family have verbalized understanding of the instructions. No further questions at time of discharge.    New Prescriptions   No medications on file    Follow Up: [facility] [address] [place] [address] [place] [address] [phone] Schedule an appointment as soon as possible for a visit    [facility] [address] [place] [address] [phone]        Nathanyel Defenbaugh, Canary Brim, MD 05/23/21 [phone]

## 2021-05-23 NOTE — Discharge Instructions (Signed)
Your history, exam, work-up today revealed strep throat.  Your exam did not show evidence of peritonsillar abscess or deep space neck infection.  Your exam was otherwise reassuring.  We feel you are safe for discharge home after getting the steroid shot to help with inflammation and the antibiotic shot.  Please rest and stay hydrated and follow-up with your primary doctor.  If any symptoms change or worsen acutely, please return to the nearest Emergency Department.

## 2021-05-23 NOTE — ED Notes (Addendum)
Pt tolerated PO fluids

## 2021-08-20 ENCOUNTER — Ambulatory Visit (HOSPITAL_COMMUNITY)
Admission: EM | Admit: 2021-08-20 | Discharge: 2021-08-20 | Disposition: A | Discharge status: Home / Self Care | Payer: 59 | Attending: Family Medicine | Admitting: Family Medicine

## 2021-08-20 ENCOUNTER — Encounter (HOSPITAL_COMMUNITY): Payer: Self-pay

## 2021-08-20 DIAGNOSIS — L600 Ingrowing nail: Secondary | ICD-10-CM

## 2021-08-20 MED ORDER — AMOXICILLIN-POT CLAVULANATE 875-125 MG PO TABS: 1.0000 | ORAL_TABLET | Freq: Two times a day (BID) | ORAL | 0 refills | Status: AC

## 2021-08-20 NOTE — ED Triage Notes (Signed)
Pt is here for toe injury , its red swollen , and painful  Injury occurred a few months ago but, toe was re injured today

## 2021-08-20 NOTE — ED Provider Notes (Signed)
[facility]    CSN: [id number] Arrival date & time: 08/20/21  1944      History   Chief Complaint Chief Complaint  Patient presents with   Toe Injury    Pt is here for toe injury on right foot . Injury happened a few months ago but,now toe is swollen red and very painful    HPI Sierra Murray is a 31 y.o. female.   HPI Here for pain and swelling and redness around her right great toe. About November of last year she dropped a bunch of cans on her right great toe.  That pain and swelling did improve over time.  The nail was injured and it finally just grew off this last week or 2.  Then in the last day she has noticed swelling around the medial nail fold and redness there.  She also has a little bleeding along the nail fold.  No fever or chills.  Last menstrual cycle was August 14   Past Medical History:  Diagnosis Date   Normal pregnancy 10/29/2011   Normal pregnancy, first 10/28/2011   SVD (spontaneous vaginal delivery) 10/29/2011    Patient Active Problem List   Diagnosis Date Noted   Birth control counseling 02/23/2016   Vaginal discharge 02/23/2016   Acanthosis nigricans 02/23/2016    History reviewed. No pertinent surgical history.  OB History     Gravida  1   Para  1   Term  1   Preterm      AB      Living  1      SAB      IAB      Ectopic      Multiple      Live Births  1            Home Medications    Prior to Admission medications   Medication Sig Start Date End Date Taking? Authorizing Provider  amoxicillin-clavulanate (AUGMENTIN) 875-125 MG tablet Take 1 tablet by mouth 2 (two) times daily for 7 days. 08/20/21 08/27/21 Yes Zenia Resides, MD  ibuprofen (ADVIL) 600 MG tablet Take 1 tablet (600 mg total) by mouth every 6 (six) hours as needed. 09/03/18   Janace Aris, NP    Family History Family History  Problem Relation Age of Onset   Hyperlipidemia Mother    Hypertension Mother    Stroke Maternal  Grandmother    Hypertension Maternal Grandmother    Hyperlipidemia Maternal Grandmother     Social History Social History   Tobacco Use   Smoking status: Never   Smokeless tobacco: Never  Vaping Use   Vaping Use: Never used  Substance Use Topics   Alcohol use: No   Drug use: No     Allergies   Patient has no known allergies.   Review of Systems Review of Systems   Physical Exam Triage Vital Signs ED Triage Vitals  Enc Vitals Group     BP 08/20/21 1959 120/85     Pulse Rate 08/20/21 1959 91     Resp 08/20/21 1959 12     Temp 08/20/21 1959 98.2 F (36.8 C)     Temp Source 08/20/21 1959 Oral     SpO2 08/20/21 1959 98 %     Weight 08/20/21 1957 204 lb (92.5 kg)     Height 08/20/21 1957 5\' 5"  (1.651 m)     Head Circumference --      Peak Flow --  Pain Score 08/20/21 1956 8     Pain Loc --      Pain Edu? --      Excl. in GC? --    No data found.  Updated Vital Signs BP 120/85 (BP Location: Left Arm)   Pulse 91   Temp 98.2 F (36.8 C) (Oral)   Resp 12   Ht 5\' 5"  (1.651 m)   Wt 92.5 kg   LMP 08/16/2021   SpO2 98%   BMI 33.95 kg/m   Visual Acuity Right Eye Distance:   Left Eye Distance:   Bilateral Distance:    Right Eye Near:   Left Eye Near:    Bilateral Near:     Physical Exam Vitals reviewed.  Constitutional:      General: She is not in acute distress.    Appearance: She is not ill-appearing, toxic-appearing or diaphoretic.  Skin:    Coloration: Skin is not pale.     Comments: There is swelling and erythema of the medial nail fold of the great toenail.  No bogginess or fluctuance there is little blood in the nail fold but no purulent drainage.  There is also little swelling and tenderness along the proximal nail fold  Neurological:     Mental Status: She is alert and oriented to person, place, and time.  Psychiatric:        Behavior: Behavior normal.      UC Treatments / Results  Labs (all labs ordered are listed, but only  abnormal results are displayed) Labs Reviewed - No data to display  EKG   Radiology No results found.  Procedures Procedures (including critical care time)  Medications Ordered in UC Medications - No data to display  Initial Impression / Assessment and Plan / UC Course  I have reviewed the triage vital signs and the nursing notes.  Pertinent labs & imaging results that were available during my care of the patient were reviewed by me and considered in my medical decision making (see chart for details).     Augmentin sent.  She will do warm soaks.  She is given contact information for podiatry.  She declines a shot of Toradol. Final Clinical Impressions(s) / UC Diagnoses   Final diagnoses:  Ingrown toenail of right foot with infection     Discharge Instructions      Take amoxicillin-clavulanate 875 mg--1 tab twice daily with food for 7 days  Do warm soaks on your toe.     ED Prescriptions     Medication Sig Dispense Auth. Provider   amoxicillin-clavulanate (AUGMENTIN) 875-125 MG tablet Take 1 tablet by mouth 2 (two) times daily for 7 days. 14 tablet Pihu Basil, 08/18/2021, MD      PDMP not reviewed this encounter.   Janace Aris, MD 08/20/21 2015

## 2021-08-20 NOTE — Discharge Instructions (Addendum)
Take amoxicillin-clavulanate 875 mg--1 tab twice daily with food for 7 days  Do warm soaks on your toe.

## 2021-08-23 ENCOUNTER — Telehealth (HOSPITAL_COMMUNITY): Payer: Self-pay | Admitting: Emergency Medicine

## 2021-08-23 NOTE — Telephone Encounter (Signed)
Patient unable to see podiatrist listed on paperwork without referral.  Dr. Lehman Prom this RN sending referral, and patient update via voicemail.  Next steps discussed (establishing with PCP)

## 2021-08-31 ENCOUNTER — Ambulatory Visit: Payer: Medicaid Other | Admitting: Podiatry

## 2021-08-31 DIAGNOSIS — L600 Ingrowing nail: Secondary | ICD-10-CM | POA: Diagnosis not present

## 2021-08-31 NOTE — Progress Notes (Signed)
Subjective:   Patient ID: Sierra Murray, female   DOB: 31 y.o.   MRN: [id number]   HPI 31 year old female presents the above concerns. Last month acryliic came off and part of the nail come off and as it grow back it tarted getting ingrown. Her daugher as also stepped on it. She states prev she dropped a can on it last Thanksgiving.  Urgent started Amoxicillin.   Review of Systems  All other systems reviewed and are negative.  Past Medical History:  Diagnosis Date   Normal pregnancy 10/29/2011   Normal pregnancy, first 10/28/2011   SVD (spontaneous vaginal delivery) 10/29/2011    No past surgical history on file.   Current Outpatient Medications:    amoxicillin (AMOXIL) 875 MG tablet, Take 875 mg by mouth 2 (two) times daily., Disp: , Rfl:   No Known Allergies        Objective:  Physical Exam  General: AAO x3, NAD  Dermatological: Incurvation present to the right medial hallux nail border with tenderness palpation there is localized edema there is no drainage or pus or ascending cellulitis.  The nail in general is hypertrophic, dystrophic.  Vascular: Dorsalis Pedis artery and Posterior Tibial artery pedal pulses are 2/4 bilateral with immedate capillary fill time.  There is no pain with calf compression, swelling, warmth, erythema.   Neruologic: Grossly intact via light touch bilateral.   Musculoskeletal: Tenderness of ingrown toenail but no other areas of discomfort noted today.  Muscular strength 5/5 in all groups tested bilateral.  Gait: Unassisted, Nonantalgic.       Assessment:   Right medial hallux ingrown toenail     Plan:  -Treatment options discussed including all alternatives, risks, and complications -Etiology of symptoms were discussed -At this time, the patient is requesting partial nail removal with chemical matricectomy to the symptomatic portion of the nail. Risks and complications were discussed with the patient for which they understand and  written consent was obtained. Under sterile conditions a total of 3 mL of a mixture of 2% lidocaine plain and 0.5% Marcaine plain was infiltrated in a hallux block fashion. Once anesthetized, the skin was prepped in sterile fashion. A tourniquet was then applied. Next the medial aspect of hallux nail border was then sharply excised making sure to remove the entire offending nail border. Once the nails were ensured to be removed area was debrided and the underlying skin was intact. There is no purulence identified in the procedure. Next phenol was then applied under standard conditions and copiously irrigated. Silvadene was applied. A dry sterile dressing was applied. After application of the dressing the tourniquet was removed and there is found to be an immediate capillary refill time to the digit. The patient tolerated the procedure well any complications. Post procedure instructions were discussed the patient for which he verbally understood. Follow-up in one week for nail check or sooner if any problems are to arise. Discussed signs/symptoms of infection and directed to call the office immediately should any occur or go directly to the emergency room. In the meantime, encouraged to call the office with any questions, concerns, changes symptoms. -Finish antibiotics   Vivi Barrack DPM

## 2021-08-31 NOTE — Patient Instructions (Signed)
Place 1/4 cup of epsom salts in a quart of warm tap water.  Submerge your foot or feet in the solution and soak for 20 minutes.  This soak should be done twice a day.  Next, remove your foot or feet from solution, blot dry the affected area. Apply ointment and cover if instructed by your doctor.   IF YOUR SKIN BECOMES IRRITATED WHILE USING THESE INSTRUCTIONS, IT IS OKAY TO SWITCH TO  WHITE VINEGAR AND WATER.  As another alternative soak, you may use antibacterial soap and water.  Monitor for any signs/symptoms of infection. Call the office immediately if any occur or go directly to the emergency room. Call with any questions/concerns. Ingrown Toenail  An ingrown toenail occurs when the corner or sides of a toenail grow into the surrounding skin. This causes discomfort and pain. The big toe is most commonly affected, but any of the toes can be affected. If an ingrown toenail is not treated, it can become infected. What are the causes? This condition may be caused by: Wearing shoes that are too small or tight. An injury, such as stubbing your toe or having your toe stepped on. Improper cutting or care of your toenails. Having nail or foot abnormalities that were present from birth (congenital abnormalities), such as having a nail that is too big for your toe. What increases the risk? The following factors may make you more likely to develop ingrown toenails: Age. Nails tend to get thicker with age, so ingrown nails are more common among older people. Cutting your toenails incorrectly, such as cutting them very short or cutting them unevenly. An ingrown toenail is more likely to get infected if you have: Diabetes. Blood flow (circulation) problems. What are the signs or symptoms? Symptoms of an ingrown toenail may include: Pain, soreness, or tenderness. Redness. Swelling. Hardening of the skin that surrounds the toenail. Signs that an ingrown toenail may be infected include: Fluid or  pus. Symptoms that get worse. How is this diagnosed? Ingrown toenails may be diagnosed based on: Your symptoms and medical history. A physical exam. Labs or tests. If you have fluid or blood coming from your toenail, a sample may be collected to test for the specific type of bacteria that is causing the infection. How is this treated? Treatment depends on the severity of your symptoms. You may be able to care for your toenail at home. If you have an infection, you may be prescribed antibiotic medicines. If you have fluid or pus draining from your toenail, your health care provider may drain it. If you have trouble walking, you may be given crutches to use. If you have a severe or infected ingrown toenail, you may need a procedure to remove part or all of the nail. Follow these instructions at home: Foot care  Check your wound every day for signs of infection, or as often as told by your health care provider. Check for: More redness, swelling, or pain. More fluid or blood. Warmth. Pus or a bad smell. Do not pick at your toenail or try to remove it yourself. Soak your foot in warm, soapy water. Do this for 20 minutes, 3 times a day, or as often as told by your health care provider. This helps to keep your toe clean and your skin soft. Wear shoes that fit well and are not too tight. Your health care provider may recommend that you wear open-toed shoes while you heal. Trim your toenails regularly and carefully. Cut your toenails   straight across to prevent injury to the skin at the corners of the toenail. Do not cut your nails in a curved shape. Keep your feet clean and dry to help prevent infection. General instructions Take over-the-counter and prescription medicines only as told by your health care provider. If you were prescribed an antibiotic, take it as told by your health care provider. Do not stop taking the antibiotic even if you start to feel better. If your health care provider  told you to use crutches to help you move around, use them as instructed. Return to your normal activities as told by your health care provider. Ask your health care provider what activities are safe for you. Keep all follow-up visits. This is important. Contact a health care provider if: You have more redness, swelling, pain, or other symptoms that do not improve with treatment. You have fluid, blood, or pus coming from your toenail. You have a red streak on your skin that starts at your foot and spreads up your leg. You have a fever. Summary An ingrown toenail occurs when the corner or sides of a toenail grow into the surrounding skin. This causes discomfort and pain. The big toe is most commonly affected, but any of the toes can be affected. If an ingrown toenail is not treated, it can become infected. Fluid or pus draining from your toenail is a sign of infection. Your health care provider may need to drain it. You may be given antibiotics to treat the infection. Trimming your toenails regularly and properly can help you prevent an ingrown toenail. This information is not intended to replace advice given to you by your health care provider. Make sure you discuss any questions you have with your health care provider. Document Revised: 04/21/2020 Document Reviewed: 04/21/2020 Elsevier Patient Education  2023 [employer]  

## 2022-05-15 ENCOUNTER — Ambulatory Visit (HOSPITAL_COMMUNITY)
Admission: EM | Admit: 2022-05-15 | Discharge: 2022-05-15 | Disposition: A | Discharge status: Home / Self Care | Payer: 59 | Attending: Emergency Medicine | Admitting: Emergency Medicine

## 2022-05-15 ENCOUNTER — Encounter (HOSPITAL_COMMUNITY): Payer: Self-pay

## 2022-05-15 DIAGNOSIS — J039 Acute tonsillitis, unspecified: Secondary | ICD-10-CM

## 2022-05-15 LAB — POCT RAPID STREP A (OFFICE): Rapid Strep A Screen: NEGATIVE

## 2022-05-15 MED ORDER — AMOXICILLIN-POT CLAVULANATE 875-125 MG PO TABS: 1.0000 | ORAL_TABLET | Freq: Two times a day (BID) | ORAL | 0 refills | Status: DC

## 2022-05-15 NOTE — ED Triage Notes (Signed)
Here for sore throat and headache x 1 week. Denies any other symptoms.

## 2022-05-15 NOTE — ED Provider Notes (Signed)
[facility]    CSN: [id number] Arrival date & time: 05/15/22  1325      History   Chief Complaint Chief Complaint  Patient presents with   Sore Throat    HPI Sierra Murray is a 32 y.o. female.   Patient presents for evaluation of a sore throat, left-sided neck pain and intermittent posterior headache beginning 7 days ago.  No known sick contacts prior.  Tolerating food and liquids.  Has attempted use of Tylenol which has been minimally effective.  Denies congestion, ear pain or cough.    Past Medical History:  Diagnosis Date   Normal pregnancy 10/29/2011   Normal pregnancy, first 10/28/2011   SVD (spontaneous vaginal delivery) 10/29/2011    Patient Active Problem List   Diagnosis Date Noted   Birth control counseling 02/23/2016   Vaginal discharge 02/23/2016   Acanthosis nigricans 02/23/2016    History reviewed. No pertinent surgical history.  OB History     Gravida  1   Para  1   Term  1   Preterm      AB      Living  1      SAB      IAB      Ectopic      Multiple      Live Births  1            Home Medications    Prior to Admission medications   Medication Sig Start Date End Date Taking? Authorizing Provider  amoxicillin (AMOXIL) 875 MG tablet Take 875 mg by mouth 2 (two) times daily. 08/10/21   [provider]    Family History Family History  Problem Relation Age of Onset   Hyperlipidemia Mother    Hypertension Mother    Stroke Maternal Grandmother    Hypertension Maternal Grandmother    Hyperlipidemia Maternal Grandmother     Social History Social History   Tobacco Use   Smoking status: Never   Smokeless tobacco: Never  Vaping Use   Vaping Use: Never used  Substance Use Topics   Alcohol use: No   Drug use: No     Allergies   Patient has no known allergies.   Review of Systems Review of Systems  Constitutional: Negative.   HENT:  Positive for sore throat. Negative for congestion, dental  problem, drooling, ear discharge, ear pain, facial swelling, hearing loss, mouth sores, nosebleeds, postnasal drip, rhinorrhea, sinus pressure, sinus pain, sneezing, tinnitus, trouble swallowing and voice change.   Respiratory: Negative.    Cardiovascular: Negative.   Gastrointestinal: Negative.   Neurological:  Positive for headaches. Negative for dizziness, tremors, seizures, syncope, facial asymmetry, speech difficulty, weakness, light-headedness and numbness.     Physical Exam Triage Vital Signs ED Triage Vitals [05/15/22 1343]  Enc Vitals Group     BP (!) 135/93     Pulse Rate 81     Resp 16     Temp 98.6 F (37 C)     Temp Source Oral     SpO2 98 %     Weight      Height      Head Circumference      Peak Flow      Pain Score      Pain Loc      Pain Edu?      Excl. in GC?    No data found.  Updated Vital Signs BP (!) 135/93 (BP Location: Left Arm)   Pulse  81   Temp 98.6 F (37 C) (Oral)   Resp 16   SpO2 98%   Visual Acuity Right Eye Distance:   Left Eye Distance:   Bilateral Distance:    Right Eye Near:   Left Eye Near:    Bilateral Near:     Physical Exam Constitutional:      Appearance: She is well-developed.  HENT:     Right Ear: Tympanic membrane normal.     Left Ear: Tympanic membrane and ear canal normal.     Nose: No congestion or rhinorrhea.     Mouth/Throat:     Pharynx: Posterior oropharyngeal erythema present.     Tonsils: Tonsillar exudate present. 1+ on the right. 1+ on the left.  Cardiovascular:     Rate and Rhythm: Normal rate and regular rhythm.     Heart sounds: Normal heart sounds.  Pulmonary:     Effort: Pulmonary effort is normal.     Breath sounds: Normal breath sounds.  Skin:    General: Skin is warm and dry.  Neurological:     General: No focal deficit present.     Mental Status: She is alert and oriented to person, place, and time.      UC Treatments / Results  Labs (all labs ordered are listed, but only abnormal  results are displayed) Labs Reviewed  POCT RAPID STREP A (OFFICE)    EKG   Radiology No results found.  Procedures Procedures (including critical care time)  Medications Ordered in UC Medications - No data to display  Initial Impression / Assessment and Plan / UC Course  I have reviewed the triage vital signs and the nursing notes.  Pertinent labs & imaging results that were available during my care of the patient were reviewed by me and considered in my medical decision making (see chart for details).  Acute tonsillitis  On exam erythema is present to the oropharynx with exudate, rapid strep test is negative, based on presentation of throat will provide bacterial coverage as symptoms have been present for 7 days, prescribed Augmentin and recommended additional supportive measures, may follow-up with his urgent care as needed if symptoms persist or worsen Final Clinical Impressions(s) / UC Diagnoses   Final diagnoses:  None   Discharge Instructions   None    ED Prescriptions   None    PDMP not reviewed this encounter.   Valinda Hoar, NP 05/15/22 1434

## 2022-05-15 NOTE — Discharge Instructions (Addendum)
Your rapid strep test today was negative, due to the appearance of your throat will treat you for tonsillitis and provide bacterial coverage  Take amoxicillin twice a day for the next 10 days, daily will see improvement in about 48 hours and steady progression from there  To be use of salt gargles throat lozenges, warm liquids, teaspoons of honey and over-the-counter clippers septic spray for comfort  May give Tylenol or Motrin every 6 hours as needed for additional comfort  You may follow-up at urgent care as needed

## 2022-06-07 ENCOUNTER — Encounter (HOSPITAL_COMMUNITY): Payer: Self-pay | Admitting: Emergency Medicine

## 2022-06-07 ENCOUNTER — Other Ambulatory Visit: Payer: Self-pay

## 2022-06-07 ENCOUNTER — Ambulatory Visit (HOSPITAL_COMMUNITY)
Admission: EM | Admit: 2022-06-07 | Discharge: 2022-06-07 | Disposition: A | Discharge status: Home / Self Care | Payer: 59 | Attending: Internal Medicine | Admitting: Internal Medicine

## 2022-06-07 DIAGNOSIS — N939 Abnormal uterine and vaginal bleeding, unspecified: Secondary | ICD-10-CM | POA: Diagnosis not present

## 2022-06-07 DIAGNOSIS — Z3202 Encounter for pregnancy test, result negative: Secondary | ICD-10-CM | POA: Diagnosis not present

## 2022-06-07 DIAGNOSIS — J019 Acute sinusitis, unspecified: Secondary | ICD-10-CM

## 2022-06-07 LAB — POCT URINE PREGNANCY: Preg Test, Ur: NEGATIVE

## 2022-06-07 MED ORDER — GUAIFENESIN ER 1200 MG PO TB12: 1200.0000 mg | ORAL_TABLET | Freq: Two times a day (BID) | ORAL | 0 refills | Status: DC

## 2022-06-07 MED ORDER — DOXYCYCLINE HYCLATE 100 MG PO CAPS: 100.0000 mg | ORAL_CAPSULE | Freq: Two times a day (BID) | ORAL | 0 refills | Status: DC

## 2022-06-07 MED ORDER — BENZONATATE 100 MG PO CAPS: 100.0000 mg | ORAL_CAPSULE | Freq: Three times a day (TID) | ORAL | 0 refills | Status: DC

## 2022-06-07 MED ORDER — PROMETHAZINE-DM 6.25-15 MG/5ML PO SYRP: 5.0000 mL | ORAL_SOLUTION | Freq: Every evening | ORAL | 0 refills | Status: DC | PRN

## 2022-06-07 NOTE — ED Triage Notes (Addendum)
Symptoms for uri started 5/24.  Patient returned from [place] that week.  Cough, head congestion, runny nose, left ear pain  Has been taking robitussin and nasal decongestant-d ([facility]).    Vaginal bleeding for 1 1/2 weeks.  Patient has an IUD.  Patient is having mild cramping.   This is "normal " time for cycle, but passing large clots that are concerning her

## 2022-06-07 NOTE — Discharge Instructions (Signed)
Doxycycline antibiotic for sinus infection as prescribed. Tessalon Perles every 8 hours as needed for cough. Promethazine DM at bedtime as needed for cough-this can make you sleepy so do not take it during the day.  Guaifenesin as needed to break up mucus every 12 hours. Drink plenty of water.  Your vaginal bleeding is likely your normal menstrual cycle.  Please schedule an appointment for follow-up with your OB/GYN as discussed.  If you develop any new or worsening symptoms or do not improve in the next 2 to 3 days, please return.  If your symptoms are severe, please go to the emergency room.  Follow-up with your primary care provider for further evaluation and management of your symptoms as well as ongoing wellness visits.  I hope you feel better!

## 2022-06-07 NOTE — ED Provider Notes (Signed)
[facility]    CSN: [id number] Arrival date & time: 06/07/22  1755      History   Chief Complaint Chief Complaint  Patient presents with   URI    HPI Sierra Murray is a 32 y.o. female.   Patient presents to urgent care for evaluation of cough, nasal congestion, frontal headache, and left ear pain that started on 05/27/22 after she returned from vacation in [place] (10-11 days ago). No recent fevers or recent known sick contacts with similar symptoms. Cough is productive and worse at nighttime. Sputum and nasal mucous is yellow/green but sometimes clear. No sore throat, dizziness, N/V/D, abdominal pain, rash, or vision changes. Denies history of chronic respiratory problems. Never smoker, denies drug use. Taking over the counter cough and cold medications (sudafed and robitussin etc) and nothing is helping with congestion.   She would also like to be evaluated for vaginal bleeding that started 1.5 weeks ago. She has an IUD and states she usually has a menstrual cycle once monthly. This time, she states she is "passing big clots" which is causing concern as this is abnormal for her. Denies history of iron deficiency anemia related to menstrual bleeding. Denies fatigue, shortness of breath, dizziness, urinary symptoms, and vaginal odor/itching/rash. She is changing her menstrual pad approximately twice daily. Reports associated mild menstrual cramping.   URI   Past Medical History:  Diagnosis Date   Normal pregnancy 10/29/2011   Normal pregnancy, first 10/28/2011   SVD (spontaneous vaginal delivery) 10/29/2011    Patient Active Problem List   Diagnosis Date Noted   Birth control counseling 02/23/2016   Vaginal discharge 02/23/2016   Acanthosis nigricans 02/23/2016    History reviewed. No pertinent surgical history.  OB History     Gravida  1   Para  1   Term  1   Preterm      AB      Living  1      SAB      IAB      Ectopic      Multiple       Live Births  1            Home Medications    Prior to Admission medications   Medication Sig Start Date End Date Taking? Authorizing Provider  benzonatate (TESSALON) 100 MG capsule Take 1 capsule (100 mg total) by mouth every 8 (eight) hours. 06/07/22  Yes Carlisle Beers, FNP  doxycycline (VIBRAMYCIN) 100 MG capsule Take 1 capsule (100 mg total) by mouth 2 (two) times daily. 06/07/22  Yes Carlisle Beers, FNP  Guaifenesin 1200 MG TB12 Take 1 tablet (1,200 mg total) by mouth in the morning and at bedtime. 06/07/22  Yes Carlisle Beers, FNP  promethazine-dextromethorphan (PROMETHAZINE-DM) 6.25-15 MG/5ML syrup Take 5 mLs by mouth at bedtime as needed for cough. 06/07/22  Yes StanhopeDonavan Burnet, FNP    Family History Family History  Problem Relation Age of Onset   Hyperlipidemia Mother    Hypertension Mother    Stroke Maternal Grandmother    Hypertension Maternal Grandmother    Hyperlipidemia Maternal Grandmother     Social History Social History   Tobacco Use   Smoking status: Never   Smokeless tobacco: Never  Vaping Use   Vaping Use: Never used  Substance Use Topics   Alcohol use: Yes   Drug use: No     Allergies   Patient has no known allergies.   Review of  Systems Review of Systems Per HPI  Physical Exam Triage Vital Signs ED Triage Vitals  Enc Vitals Group     BP 06/07/22 1905 135/80     Pulse Rate 06/07/22 1905 98     Resp 06/07/22 1905 20     Temp 06/07/22 1905 98.4 F (36.9 C)     Temp Source 06/07/22 1905 Oral     SpO2 06/07/22 1905 99 %     Weight --      Height --      Head Circumference --      Peak Flow --      Pain Score 06/07/22 1902 0     Pain Loc --      Pain Edu? --      Excl. in GC? --    No data found.  Updated Vital Signs BP 135/80 (BP Location: Right Arm)   Pulse 98   Temp 98.4 F (36.9 C) (Oral)   Resp 20   SpO2 99%   Visual Acuity Right Eye Distance:   Left Eye Distance:   Bilateral Distance:     Right Eye Near:   Left Eye Near:    Bilateral Near:     Physical Exam Vitals and nursing note reviewed.  Constitutional:      Appearance: She is not ill-appearing or toxic-appearing.  HENT:     Head: Normocephalic and atraumatic.     Right Ear: Hearing, tympanic membrane, ear canal and external ear normal.     Left Ear: Hearing, tympanic membrane, ear canal and external ear normal.     Nose: Congestion present.     Right Sinus: Frontal sinus tenderness present.     Left Sinus: Frontal sinus tenderness present.     Mouth/Throat:     Lips: Pink.     Mouth: Mucous membranes are moist. No injury.     Tongue: No lesions. Tongue does not deviate from midline.     Palate: No mass and lesions.     Pharynx: Oropharynx is clear. Uvula midline. No pharyngeal swelling, oropharyngeal exudate, posterior oropharyngeal erythema or uvula swelling.     Tonsils: No tonsillar exudate or tonsillar abscesses.  Eyes:     General: Lids are normal. Vision grossly intact. Gaze aligned appropriately.     Extraocular Movements: Extraocular movements intact.     Conjunctiva/sclera: Conjunctivae normal.  Cardiovascular:     Rate and Rhythm: Normal rate and regular rhythm.     Heart sounds: Normal heart sounds, S1 normal and S2 normal.  Pulmonary:     Effort: Pulmonary effort is normal. No respiratory distress.     Breath sounds: Normal breath sounds and air entry. No decreased breath sounds, wheezing, rhonchi or rales.  Abdominal:     General: Abdomen is flat. Bowel sounds are normal.     Palpations: Abdomen is soft.     Tenderness: There is no abdominal tenderness. There is no right CVA tenderness, left CVA tenderness or guarding.  Musculoskeletal:     Cervical back: Neck supple.  Lymphadenopathy:     Cervical: Cervical adenopathy present.  Skin:    General: Skin is warm and dry.     Capillary Refill: Capillary refill takes less than 2 seconds.     Findings: No rash.  Neurological:     General:  No focal deficit present.     Mental Status: She is alert and oriented to person, place, and time. Mental status is at baseline.     Cranial Nerves:  No dysarthria or facial asymmetry.  Psychiatric:        Mood and Affect: Mood normal.        Speech: Speech normal.        Behavior: Behavior normal.        Thought Content: Thought content normal.        Judgment: Judgment normal.      UC Treatments / Results  Labs (all labs ordered are listed, but only abnormal results are displayed) Labs Reviewed  POCT URINE PREGNANCY    EKG   Radiology No results found.  Procedures Procedures (including critical care time)  Medications Ordered in UC Medications - No data to display  Initial Impression / Assessment and Plan / UC Course  I have reviewed the triage vital signs and the nursing notes.  Pertinent labs & imaging results that were available during my care of the patient were reviewed by me and considered in my medical decision making (see chart for details).   1. Acute sinusitis with symptoms greater than 10 days Presentation consistent with post-viral secondary bacterial infection to the frontal sinuses. Symptoms have not responded well to OTC methods of treatment for the last 10-11 days, therefore will provide antibiotic therapy. She recently had Augmentin for tonsillitis approximately 30 days ago. Will treat with doxycycline BID for 7 days. Advised mucinex for further symptomatic relief. No indication for imaging of the chest as lungs are clear and vitals are stable. Tessalon perles and promethazine DM as needed for cough. She is agreeable with plan.   2. Vaginal bleeding, negative pregnancy test UPT is negative. Would like to check CBC, patient declines this today due to time constraint stating "I think this is just my normal period". Encouraged her to schedule an appointment with OB/GYN for follow-up as needed. She is not currently experiencing any signs/symptoms of anemia.    Discussed physical exam and available lab work findings in clinic with patient.  Counseled patient regarding appropriate use of medications and potential side effects for all medications recommended or prescribed today. Discussed red flag signs and symptoms of worsening condition,when to call the PCP office, return to urgent care, and when to seek higher level of care in the emergency department. Patient verbalizes understanding and agreement with plan. All questions answered. Patient discharged in stable condition.    Final Clinical Impressions(s) / UC Diagnoses   Final diagnoses:  Acute sinusitis with symptoms > 10 days  Negative pregnancy test  Vaginal bleeding     Discharge Instructions      Doxycycline antibiotic for sinus infection as prescribed. Tessalon Perles every 8 hours as needed for cough. Promethazine DM at bedtime as needed for cough-this can make you sleepy so do not take it during the day.  Guaifenesin as needed to break up mucus every 12 hours. Drink plenty of water.  Your vaginal bleeding is likely your normal menstrual cycle.  Please schedule an appointment for follow-up with your OB/GYN as discussed.  If you develop any new or worsening symptoms or do not improve in the next 2 to 3 days, please return.  If your symptoms are severe, please go to the emergency room.  Follow-up with your primary care provider for further evaluation and management of your symptoms as well as ongoing wellness visits.  I hope you feel better!      ED Prescriptions     Medication Sig Dispense Auth. Provider   doxycycline (VIBRAMYCIN) 100 MG capsule Take 1 capsule (100 mg total) by  mouth 2 (two) times daily. 20 capsule Reita May M, FNP   benzonatate (TESSALON) 100 MG capsule Take 1 capsule (100 mg total) by mouth every 8 (eight) hours. 21 capsule Carlisle Beers, FNP   promethazine-dextromethorphan (PROMETHAZINE-DM) 6.25-15 MG/5ML syrup Take 5 mLs by mouth at  bedtime as needed for cough. 118 mL Reita May M, FNP   Guaifenesin 1200 MG TB12 Take 1 tablet (1,200 mg total) by mouth in the morning and at bedtime. 14 tablet Carlisle Beers, FNP      PDMP not reviewed this encounter.   Carlisle Beers, [place] 06/11/22 2024

## 2022-07-07 ENCOUNTER — Ambulatory Visit (HOSPITAL_COMMUNITY): Payer: Self-pay

## 2022-10-31 ENCOUNTER — Encounter (HOSPITAL_COMMUNITY): Payer: Self-pay

## 2022-10-31 ENCOUNTER — Ambulatory Visit (HOSPITAL_COMMUNITY)
Admission: RE | Admit: 2022-10-31 | Discharge: 2022-10-31 | Disposition: A | Discharge status: Home / Self Care | Payer: 59 | Source: Ambulatory Visit | Attending: Family Medicine | Admitting: Family Medicine

## 2022-10-31 VITALS — BP 133/80 | HR 95 | Temp 98.4°F | Resp 18

## 2022-10-31 DIAGNOSIS — L03317 Cellulitis of buttock: Secondary | ICD-10-CM

## 2022-10-31 MED ORDER — KETOROLAC TROMETHAMINE 30 MG/ML IJ SOLN
30.0000 mg | Freq: Once | INTRAMUSCULAR | Status: AC
  Administered 2022-10-31: 30 mg via INTRAMUSCULAR

## 2022-10-31 MED ORDER — HYDROCODONE-ACETAMINOPHEN 5-325 MG PO TABS: 1.0000 | ORAL_TABLET | Freq: Four times a day (QID) | ORAL | 0 refills | Status: DC | PRN

## 2022-10-31 MED ORDER — KETOROLAC TROMETHAMINE 10 MG PO TABS: 10.0000 mg | ORAL_TABLET | Freq: Four times a day (QID) | ORAL | 0 refills | Status: DC | PRN

## 2022-10-31 MED ORDER — AMOXICILLIN-POT CLAVULANATE 875-125 MG PO TABS: 1.0000 | ORAL_TABLET | Freq: Two times a day (BID) | ORAL | 0 refills | Status: AC

## 2022-10-31 MED ORDER — KETOROLAC TROMETHAMINE 30 MG/ML IJ SOLN
INTRAMUSCULAR | Status: AC
  Filled 2022-10-31: qty 1

## 2022-10-31 NOTE — Discharge Instructions (Signed)
You have been given a shot of Toradol 30 mg today.  Ketorolac 10 mg tablets--take 1 tablet every 6 hours as needed for pain.  This is the same medicine that is in the shot we just gave you  Hydrocodone 5 mg--1 tablet every 6 hours as needed for pain.  This is best taken with food.  It can cause sleepiness or dizziness  Take amoxicillin-clavulanate 875 mg--1 tab twice daily with food for 7 days  Do warm soaks/compresses at least 2 or 3 times a day, but she can do it more.  An ice cube can actually help the pain some  If you are worsening, please return to be reevaluated

## 2022-10-31 NOTE — ED Provider Notes (Signed)
[facility]    CSN: [id number] Arrival date & time: 10/31/22  1125      History   Chief Complaint Chief Complaint  Patient presents with   Abscess    PILONIDAL CYST WITH ABSCESS - Entered by patient    HPI Sierra Murray is a 32 y.o. female.    Abscess  Here for swelling and tenderness and pain in her right upper buttock near her gluteal crevice.  On October 23 she began noticing a little bit of tenderness and by October 26 it had worsened a good bit and the swelling and induration was very large. No fever or chills  She is not allergy medications p  Last menstrual cycle was October 4. She has had a pilonidal cyst drained previously, about 10 years ago.  She did take 1 days dosing of Augmentin that she had from a previous prescription.   Past Medical History:  Diagnosis Date   Normal pregnancy 10/29/2011   Normal pregnancy, first 10/28/2011   SVD (spontaneous vaginal delivery) 10/29/2011    Patient Active Problem List   Diagnosis Date Noted   Birth control counseling 02/23/2016   Vaginal discharge 02/23/2016   Acanthosis nigricans 02/23/2016    History reviewed. No pertinent surgical history.  OB History     Gravida  1   Para  1   Term  1   Preterm      AB      Living  1      SAB      IAB      Ectopic      Multiple      Live Births  1            Home Medications    Prior to Admission medications   Medication Sig Start Date End Date Taking? Authorizing Provider  amoxicillin-clavulanate (AUGMENTIN) 875-125 MG tablet Take 1 tablet by mouth 2 (two) times daily for 7 days. 10/31/22 11/07/22 Yes Bryant Lipps, Janace Aris, MD  HYDROcodone-acetaminophen (NORCO/VICODIN) 5-325 MG tablet Take 1 tablet by mouth every 6 (six) hours as needed (pain). 10/31/22  Yes Zenia Resides, MD  ketorolac (TORADOL) 10 MG tablet Take 1 tablet (10 mg total) by mouth every 6 (six) hours as needed (pain). 10/31/22  Yes Zenia Resides, MD     Family History Family History  Problem Relation Age of Onset   Hyperlipidemia Mother    Hypertension Mother    Stroke Maternal Grandmother    Hypertension Maternal Grandmother    Hyperlipidemia Maternal Grandmother     Social History Social History   Tobacco Use   Smoking status: Never   Smokeless tobacco: Never  Vaping Use   Vaping status: Never Used  Substance Use Topics   Alcohol use: Yes   Drug use: No     Allergies   Patient has no known allergies.   Review of Systems Review of Systems   Physical Exam Triage Vital Signs ED Triage Vitals  Encounter Vitals Group     BP 10/31/22 1215 133/80     Systolic BP Percentile --      Diastolic BP Percentile --      Pulse Rate 10/31/22 1215 95     Resp 10/31/22 1215 18     Temp 10/31/22 1215 98.4 F (36.9 C)     Temp Source 10/31/22 1215 Oral     SpO2 10/31/22 1215 97 %     Weight --      Height --  Head Circumference --      Peak Flow --      Pain Score 10/31/22 1214 9     Pain Loc --      Pain Education --      Exclude from Growth Chart --    No data found.  Updated Vital Signs BP 133/80 (BP Location: Right Arm)   Pulse 95   Temp 98.4 F (36.9 C) (Oral)   Resp 18   LMP 10/07/2022 (Exact Date)   SpO2 97%   Visual Acuity Right Eye Distance:   Left Eye Distance:   Bilateral Distance:    Right Eye Near:   Left Eye Near:    Bilateral Near:     Physical Exam Vitals reviewed.  Constitutional:      General: She is not in acute distress.    Appearance: She is not toxic-appearing.     Comments: She is sitting on her left hip when I take the history in the room.  Genitourinary:    Comments: There is some induration on the superior right buttock near the gluteal crevice.  I do not find any superficial fluctuance.  There is some very mild erythema and a distribution of about 4 cm in diameter.  Chaperone is present during the time of exam   Skin:    Coloration: Skin is not pale.   Neurological:     Mental Status: She is alert.      UC Treatments / Results  Labs (all labs ordered are listed, but only abnormal results are displayed) Labs Reviewed - No data to display  EKG   Radiology No results found.  Procedures Procedures (including critical care time)  Medications Ordered in UC Medications  ketorolac (TORADOL) 30 MG/ML injection 30 mg (has no administration in time range)    Initial Impression / Assessment and Plan / UC Course  I have reviewed the triage vital signs and the nursing notes.  Pertinent labs & imaging results that were available during my care of the patient were reviewed by me and considered in my medical decision making (see chart for details).    She does state that the swelling and induration is in a much smaller area at present.  I cannot tell that there is an abscess at this time that should be drained. Toradol injection and Toradol tablets are sent to the pharmacy and hydrocodone is sent in also.  Augmentin is sent in to treat the cellulitis.  She will do warm compresses or warm soaks.  She will return if worsening   Final Clinical Impressions(s) / UC Diagnoses   Final diagnoses:  Cellulitis of buttock     Discharge Instructions      You have been given a shot of Toradol 30 mg today.  Ketorolac 10 mg tablets--take 1 tablet every 6 hours as needed for pain.  This is the same medicine that is in the shot we just gave you  Hydrocodone 5 mg--1 tablet every 6 hours as needed for pain.  This is best taken with food.  It can cause sleepiness or dizziness  Take amoxicillin-clavulanate 875 mg--1 tab twice daily with food for 7 days  Do warm soaks/compresses at least 2 or 3 times a day, but she can do it more.  An ice cube can actually help the pain some  If you are worsening, please return to be reevaluated     ED Prescriptions     Medication Sig Dispense Auth. Provider   amoxicillin-clavulanate (  AUGMENTIN)  875-125 MG tablet Take 1 tablet by mouth 2 (two) times daily for 7 days. 14 tablet Loghan Subia, Janace Aris, MD   HYDROcodone-acetaminophen (NORCO/VICODIN) 5-325 MG tablet Take 1 tablet by mouth every 6 (six) hours as needed (pain). 12 tablet Shakura Cowing, Janace Aris, MD   ketorolac (TORADOL) 10 MG tablet Take 1 tablet (10 mg total) by mouth every 6 (six) hours as needed (pain). 20 tablet Kayliee Atienza, Janace Aris, MD      I have reviewed the PDMP during this encounter.   Zenia Resides, MD 10/31/22 1247

## 2022-10-31 NOTE — ED Triage Notes (Signed)
Pt states she has had a pilonidal cyst in the past and its flared up again. She states Wednesday of last week she started having pain but denies any drainage. She did have some left over amox and she took a days dose of it. She has been taking tylenol as well.

## 2023-08-15 ENCOUNTER — Encounter (HOSPITAL_COMMUNITY): Payer: Self-pay | Admitting: Emergency Medicine

## 2023-08-15 ENCOUNTER — Ambulatory Visit (HOSPITAL_COMMUNITY)
Admission: EM | Admit: 2023-08-15 | Discharge: 2023-08-15 | Disposition: A | Discharge status: Home / Self Care | Attending: Internal Medicine | Admitting: Internal Medicine

## 2023-08-15 DIAGNOSIS — L0501 Pilonidal cyst with abscess: Secondary | ICD-10-CM

## 2023-08-15 MED ORDER — KETOROLAC TROMETHAMINE 10 MG PO TABS: 10.0000 mg | ORAL_TABLET | Freq: Four times a day (QID) | ORAL | 0 refills | Status: AC | PRN

## 2023-08-15 MED ORDER — SULFAMETHOXAZOLE-TRIMETHOPRIM 800-160 MG PO TABS: 1.0000 | ORAL_TABLET | Freq: Two times a day (BID) | ORAL | 0 refills | Status: AC

## 2023-08-15 NOTE — ED Provider Notes (Signed)
 [facility]    CSN: [id number] Arrival date & time: 08/15/23  1716      History   Chief Complaint Chief Complaint  Patient presents with   Abscess    HPI Sierra Murray is a 33 y.o. female who presents with recurrent pilonidal cyst x 2 days. This is the 3rd time. The last time she was here was placed on Augmentin  and a few days later the area got worse, so she went to [facility] and had I&D and her antibiotic was changed to Bactrim  DS. She has not seen a surgeon yet and would like some advise. The abscess has not been draining, but has been very painful. Denies fever, chills or sweats.   Past Medical History:  Diagnosis Date   Normal pregnancy 10/29/2011   Normal pregnancy, first 10/28/2011   SVD (spontaneous vaginal delivery) 10/29/2011    Patient Active Problem List   Diagnosis Date Noted   Birth control counseling 02/23/2016   Vaginal discharge 02/23/2016   Acanthosis nigricans 02/23/2016    History reviewed. No pertinent surgical history.  OB History     Gravida  1   Para  1   Term  1   Preterm      AB      Living  1      SAB      IAB      Ectopic      Multiple      Live Births  1            Home Medications    Prior to Admission medications   Medication Sig Start Date End Date Taking? Authorizing Provider  sulfamethoxazole -trimethoprim  (BACTRIM  DS) 800-160 MG tablet Take 1 tablet by mouth 2 (two) times daily for 7 days. 08/15/23 08/22/23 Yes Rodriguez-Southworth, Michaelah Credeur, PA-C  ketorolac  (TORADOL ) 10 MG tablet Take 1 tablet (10 mg total) by mouth every 6 (six) hours as needed (pain). 08/15/23   Rodriguez-Southworth, Kyra, PA-C    Family History Family History  Problem Relation Age of Onset   Hyperlipidemia Mother    Hypertension Mother    Stroke Maternal Grandmother    Hypertension Maternal Grandmother    Hyperlipidemia Maternal Grandmother     Social History Social History   Tobacco Use   Smoking status: Never    Smokeless tobacco: Never  Vaping Use   Vaping status: Never Used  Substance Use Topics   Alcohol use: Yes   Drug use: No     Allergies   Patient has no known allergies.   Review of Systems Review of Systems As noted in HPI  Physical Exam Triage Vital Signs ED Triage Vitals [08/15/23 1757]  Encounter Vitals Group     BP 118/77     Girls Systolic BP Percentile      Girls Diastolic BP Percentile      Boys Systolic BP Percentile      Boys Diastolic BP Percentile      Pulse Rate 88     Resp 17     Temp 98.2 F (36.8 C)     Temp Source Oral     SpO2 98 %     Weight      Height      Head Circumference      Peak Flow      Pain Score 9     Pain Loc      Pain Education      Exclude from Growth Chart  No data found.  Updated Vital Signs BP 118/77 (BP Location: Left Arm)   Pulse 88   Temp 98.2 F (36.8 C) (Oral)   Resp 17   LMP 07/20/2023 (Exact Date)   SpO2 98%   Visual Acuity Right Eye Distance:   Left Eye Distance:   Bilateral Distance:    Right Eye Near:   Left Eye Near:    Bilateral Near:     Physical Exam Vitals and nursing note reviewed.  Constitutional:      Appearance: She is obese. She is not ill-appearing or diaphoretic.  Eyes:     General: No scleral icterus.    Conjunctiva/sclera: Conjunctivae normal.  Pulmonary:     Effort: Pulmonary effort is normal.  Musculoskeletal:        General: Normal range of motion.     Cervical back: Neck supple.  Skin:    General: Skin is warm and dry.     Comments: Upper gluteal cleft with indurated tender abscess more to her R. There is no fluctuation or redness.   Neurological:     Mental Status: She is alert and oriented to person, place, and time.     Gait: Gait normal.  Psychiatric:        Mood and Affect: Mood normal.        Behavior: Behavior normal.        Thought Content: Thought content normal.        Judgment: Judgment normal.      UC Treatments / Results  Labs (all labs ordered are  listed, but only abnormal results are displayed) Labs Reviewed - No data to display  EKG   Radiology No results found.  Procedures Procedures (including critical care time)  Medications Ordered in UC Medications - No data to display  Initial Impression / Assessment and Plan / UC Course  I have reviewed the triage vital signs and the nursing notes.  Recurrent pilonidal abscess  I advised her to use some warm compresses for 20 minutes 2-3 times a day. I placed her on Bactrim  DS and Toradol  as noted.  She was given information of a Pilonidal specialty surgeon as noted in instructions to FU with.  Final Clinical Impressions(s) / UC Diagnoses   Final diagnoses:  Pilonidal cyst with abscess     Discharge Instructions      Follow up with [facility] Dr Zachary Maples [address] [place], [place] [address] Phone: [phone]  Hours:  Tuesday 8?AM-4:30?PM Wednesday 8?AM-4:30?PM Thursday 8?AM-4:30?PM Friday 8?AM-4:30?PM Saturday Closed Sunday Closed Monday 8?AM-4:30?PM Suggest new hours     ED Prescriptions     Medication Sig Dispense Auth. Provider   ketorolac  (TORADOL ) 10 MG tablet Take 1 tablet (10 mg total) by mouth every 6 (six) hours as needed (pain). 20 tablet Rodriguez-Southworth, Ervine Witucki, PA-C   sulfamethoxazole -trimethoprim  (BACTRIM  DS) 800-160 MG tablet Take 1 tablet by mouth 2 (two) times daily for 7 days. 14 tablet Rodriguez-Southworth, Kyra, PA-C      PDMP not reviewed this encounter.   Lindi Kyra, PA-C 08/15/23 1822

## 2023-08-15 NOTE — ED Triage Notes (Signed)
 Pt reports that hx pilonidal cyst. 2 days ago came back up. Denies drainage. Reports very painful. Taking pain killers.

## 2023-08-15 NOTE — ED Notes (Signed)
 Called pt twice phone number went straight to voicemail, did not even ring. Called from lobby and walked out front in parking lot with front desk staff, no sign of patient.

## 2023-08-15 NOTE — Discharge Instructions (Signed)
 Follow up with [facility] Dr Zachary Maples [address] [place], [place] [address] Phone: [phone]  Hours:  Tuesday 8?AM-4:30?PM Wednesday 8?AM-4:30?PM Thursday 8?AM-4:30?PM Friday 8?AM-4:30?PM Saturday Closed Sunday Closed Monday 8?AM-4:30?PM Suggest new hours

## 2023-08-16 ENCOUNTER — Ambulatory Visit (HOSPITAL_COMMUNITY): Payer: Self-pay

## 2023-08-17 ENCOUNTER — Encounter (HOSPITAL_COMMUNITY): Payer: Self-pay

## 2023-08-17 ENCOUNTER — Ambulatory Visit (HOSPITAL_COMMUNITY)
Admission: EM | Admit: 2023-08-17 | Discharge: 2023-08-17 | Disposition: A | Discharge status: Home / Self Care | Attending: Family Medicine | Admitting: Family Medicine

## 2023-08-17 DIAGNOSIS — L0501 Pilonidal cyst with abscess: Secondary | ICD-10-CM | POA: Diagnosis not present

## 2023-08-17 MED ORDER — HYDROCODONE-ACETAMINOPHEN 5-325 MG PO TABS: 1.0000 | ORAL_TABLET | Freq: Four times a day (QID) | ORAL | 0 refills | Status: AC | PRN

## 2023-08-17 NOTE — ED Provider Notes (Signed)
  [facility]   [id number] 08/17/23 Arrival Time: 0902  ASSESSMENT & PLAN:  1. Pilonidal abscess     Incision and Drainage Procedure Note  Anesthesia: 1% lidocaine  with epinephrine   Procedure Details  The procedure, risks and complications have been discussed in detail (including, but not limited to pain and bleeding) with the patient.  The skin induration was prepped and draped in the usual fashion. After adequate local anesthesia, I&D with a #11 blade was performed on the midline to right superior gluteal cleft with copious, bloody, purulent drainage.  EBL: minimal Drains: none Packing: 1/2 iodoform Condition: Tolerated procedure well Complications: none.  Meds ordered this encounter  Medications   HYDROcodone -acetaminophen  (NORCO/VICODIN) 5-325 MG tablet    Sig: Take 1 tablet by mouth every 6 (six) hours as needed for moderate pain (pain score 4-6) or severe pain (pain score 7-10).    Dispense:  8 tablet    Refill:  0   Finish bactrim . Work note provided. Wound care instructions discussed and given in written format. To return in 48 hours for wound check.  Finish all antibiotics. OTC analgesics as needed.  Reviewed expectations re: course of current medical issues. Questions answered. Outlined signs and symptoms indicating need for more acute intervention. Patient verbalized understanding. After Visit Summary given.   SUBJECTIVE:  Sierra Murray is a 33 y.o. female who is here for f/u; dx pilonidal abscess a few d ago; on Bactrim ; area enlarging and is more painful. Denies fever/drainage.   OBJECTIVE:  Vitals:   08/17/23 1106  BP: (!) 135/98  Pulse: 100  Resp: 18  Temp: 98.5 F (36.9 C)  TempSrc: Oral  SpO2: 98%    General appearance: alert; no distress Buttock: approx 1.5x3 cm induration of her superior R gluteal cleft; tender to touch; no active drainage or bleeding Psychological: alert and cooperative; normal mood and affect  No Known  Allergies  Past Medical History:  Diagnosis Date   Normal pregnancy 10/29/2011   Normal pregnancy, first 10/28/2011   SVD (spontaneous vaginal delivery) 10/29/2011   Social History   Socioeconomic History   Marital status: Single    Spouse name: Not on file   Number of children: Not on file   Years of education: Not on file   Highest education level: Not on file  Occupational History   Not on file  Tobacco Use   Smoking status: Never   Smokeless tobacco: Never  Vaping Use   Vaping status: Never Used  Substance and Sexual Activity   Alcohol use: Yes   Drug use: No   Sexual activity: Yes    Birth control/protection: I.U.D.  Other Topics Concern   Not on file  Social History Narrative   Not on file   Social Drivers of Health   Financial Resource Strain: Not on file  Food Insecurity: Not on file  Transportation Needs: Not on file  Physical Activity: Not on file  Stress: Not on file  Social Connections: Not on file   Family History  Problem Relation Age of Onset   Hyperlipidemia Mother    Hypertension Mother    Stroke Maternal Grandmother    Hypertension Maternal Grandmother    Hyperlipidemia Maternal Grandmother    History reviewed. No pertinent surgical history.          Rolinda Rogue, MD 08/17/23 1201

## 2023-08-17 NOTE — ED Triage Notes (Signed)
 Patient is here for follow-up on her pilonidal cyst. Patient thinks it needs to be drained at this time. Taking Bactrim  and pain medications.

## 2023-08-17 NOTE — Discharge Instructions (Addendum)
 Be aware, you have been prescribed pain medications that may cause drowsiness. While taking this medication, do not take any other medications containing acetaminophen (Tylenol). Do not combine with alcohol or recreational drugs. Please do not drive, operate heavy machinery, or take part in activities that require making important decisions while on this medication as your judgement may be clouded.

## 2023-08-19 ENCOUNTER — Ambulatory Visit (HOSPITAL_COMMUNITY)

## 2023-08-19 ENCOUNTER — Ambulatory Visit (HOSPITAL_COMMUNITY)
Admission: EM | Admit: 2023-08-19 | Discharge: 2023-08-19 | Disposition: A | Discharge status: Home / Self Care | Attending: Family Medicine | Admitting: Family Medicine

## 2023-08-19 ENCOUNTER — Encounter (HOSPITAL_COMMUNITY): Payer: Self-pay

## 2023-08-19 DIAGNOSIS — L0291 Cutaneous abscess, unspecified: Secondary | ICD-10-CM

## 2023-08-19 NOTE — Discharge Instructions (Signed)
 Finish the antibiotics as prescribed.  A warm soak for warm sitz bath 1-2 times daily to help circulation and help.

## 2023-08-19 NOTE — ED Triage Notes (Signed)
 Patient here today for a follow up of a pilonidal cyst. Patient is doing well. Patient is still taking her antibiotics. Patient states that some of the packing has come out.

## 2023-08-19 NOTE — ED Provider Notes (Signed)
 [facility]    CSN: [id number] Arrival date & time: 08/19/23  1105      History   Chief Complaint Chief Complaint  Patient presents with   Follow-up    HPI Sierra Murray is a 33 y.o. female.   HPI Here for recheck on her packing that was done for a pilonidal abscess on August 14.  It is feeling much better after the drainage.  She is taking her Bactrim  without any trouble.  No fever or chills.  The drainage has reduced  NKDA  Last menstrual cycle August 14   Past Medical History:  Diagnosis Date   Normal pregnancy 10/29/2011   Normal pregnancy, first 10/28/2011   SVD (spontaneous vaginal delivery) 10/29/2011    Patient Active Problem List   Diagnosis Date Noted   Birth control counseling 02/23/2016   Vaginal discharge 02/23/2016   Acanthosis nigricans 02/23/2016    History reviewed. No pertinent surgical history.  OB History     Gravida  1   Para  1   Term  1   Preterm      AB      Living  1      SAB      IAB      Ectopic      Multiple      Live Births  1            Home Medications    Prior to Admission medications   Medication Sig Start Date End Date Taking? Authorizing Provider  levonorgestrel  (MIRENA , 52 MG,) 20 MCG/DAY IUD 1 each by Intrauterine route once. 02/23/16  Yes [provider]  HYDROcodone -acetaminophen  (NORCO/VICODIN) 5-325 MG tablet Take 1 tablet by mouth every 6 (six) hours as needed for moderate pain (pain score 4-6) or severe pain (pain score 7-10). 08/17/23   Rolinda Rogue, MD  ketorolac  (TORADOL ) 10 MG tablet Take 1 tablet (10 mg total) by mouth every 6 (six) hours as needed (pain). 08/15/23   Rodriguez-Southworth, Sylvia, PA-C  sulfamethoxazole -trimethoprim  (BACTRIM  DS) 800-160 MG tablet Take 1 tablet by mouth 2 (two) times daily for 7 days. 08/15/23 08/22/23  Rodriguez-Southworth, Kyra, PA-C    Family History Family History  Problem Relation Age of Onset   Hyperlipidemia Mother     Hypertension Mother    Stroke Maternal Grandmother    Hypertension Maternal Grandmother    Hyperlipidemia Maternal Grandmother     Social History Social History   Tobacco Use   Smoking status: Never   Smokeless tobacco: Never  Vaping Use   Vaping status: Never Used  Substance Use Topics   Alcohol use: Yes   Drug use: No     Allergies   Patient has no known allergies.   Review of Systems Review of Systems   Physical Exam Triage Vital Signs ED Triage Vitals  Encounter Vitals Group     BP 08/19/23 1148 123/77     Girls Systolic BP Percentile --      Girls Diastolic BP Percentile --      Boys Systolic BP Percentile --      Boys Diastolic BP Percentile --      Pulse Rate 08/19/23 1148 90     Resp 08/19/23 1148 16     Temp 08/19/23 1148 98.5 F (36.9 C)     Temp Source 08/19/23 1148 Oral     SpO2 08/19/23 1148 98 %     Weight --      Height --  Head Circumference --      Peak Flow --      Pain Score 08/19/23 1152 3     Pain Loc --      Pain Education --      Exclude from Growth Chart --    No data found.  Updated Vital Signs BP 123/77 (BP Location: Left Arm)   Pulse 90   Temp 98.5 F (36.9 C) (Oral)   Resp 16   LMP 08/17/2023 (Exact Date)   SpO2 98%   Visual Acuity Right Eye Distance:   Left Eye Distance:   Bilateral Distance:    Right Eye Near:   Left Eye Near:    Bilateral Near:     Physical Exam Vitals reviewed.  Constitutional:      General: She is not in acute distress.    Appearance: She is not toxic-appearing.  Skin:    Coloration: Skin is not pale.     Comments: Packing is removed easily.  There is a slight amount of drainage still.  Swelling is reduced.  No erythema now.  Neurological:     Mental Status: She is alert and oriented to person, place, and time.  Psychiatric:        Behavior: Behavior normal.      UC Treatments / Results  Labs (all labs ordered are listed, but only abnormal results are displayed) Labs  Reviewed - No data to display  EKG   Radiology No results found.  Procedures Procedures (including critical care time)  Medications Ordered in UC Medications - No data to display  Initial Impression / Assessment and Plan / UC Course  I have reviewed the triage vital signs and the nursing notes.  Pertinent labs & imaging results that were available during my care of the patient were reviewed by me and considered in my medical decision making (see chart for details).   She will finish her antibiotics.  We discussed warm compresses or warm sitz bath. Final Clinical Impressions(s) / UC Diagnoses   Final diagnoses:  Abscess     Discharge Instructions      Finish the antibiotics as prescribed.  A warm soak for warm sitz bath 1-2 times daily to help circulation and help.     ED Prescriptions   None    PDMP not reviewed this encounter.   Vonna Sharlet POUR, MD 08/19/23 1240

## 2023-08-24 ENCOUNTER — Telehealth: Admitting: Physician Assistant

## 2023-08-24 DIAGNOSIS — L0591 Pilonidal cyst without abscess: Secondary | ICD-10-CM

## 2023-08-24 NOTE — Progress Notes (Signed)
  Because of continued problems with this despite drainage and treatment given in person at urgent care, I feel your condition warrants further evaluation and I recommend that you be seen in a face-to-face visit.   NOTE: There will be NO CHARGE for this E-Visit   If you are having a true medical emergency, please call 911.     For an urgent face to face visit, [place] has multiple urgent care centers for your convenience.  Click the link below for the full list of locations and hours, walk-in wait times, appointment scheduling options and driving directions:  Urgent Care - [place], [place], [place], [place], [place], [place]  [place]     Your MyChart E-visit questionnaire answers were reviewed by a board certified advanced clinical practitioner to complete your personal care plan based on your specific symptoms.    Thank you for using e-Visits.

## 2023-08-25 ENCOUNTER — Ambulatory Visit (HOSPITAL_COMMUNITY): Payer: Self-pay

## 2023-08-26 ENCOUNTER — Ambulatory Visit: Payer: Self-pay

## 2023-11-06 ENCOUNTER — Telehealth

## 2023-11-06 ENCOUNTER — Telehealth: Admitting: Emergency Medicine

## 2023-11-06 DIAGNOSIS — Z760 Encounter for issue of repeat prescription: Secondary | ICD-10-CM

## 2023-11-06 NOTE — Progress Notes (Signed)
 Because you need norethindrone and I am not able to prescribe it by Evisit, I feel your condition warrants further evaluation and I recommend that you be seen for a face to face visit.  Please contact your primary care physician practice to be seen.   NOTE: You will NOT be charged for this eVisit.  If you do not have a PCP,  offers a free physician referral service available at [phone]. Our trained staff has the experience, knowledge and resources to put you in touch with a physician who is right for you.    If you are having a true medical emergency please call 911.   Your e-visit answers were reviewed by a board certified advanced clinical practitioner to complete your personal care plan.  Thank you for using e-Visits.
